# Patient Record
Sex: Female | Born: 1965
Health system: Southern US, Community
[De-identification: ages and names within clinical notes are randomized; demographics above are authoritative.]

## PROBLEM LIST (undated history)

## (undated) DIAGNOSIS — K219 Gastro-esophageal reflux disease without esophagitis: Secondary | ICD-10-CM

## (undated) DIAGNOSIS — R079 Chest pain, unspecified: Secondary | ICD-10-CM

## (undated) DIAGNOSIS — E0590 Thyrotoxicosis, unspecified without thyrotoxic crisis or storm: Secondary | ICD-10-CM

## (undated) DIAGNOSIS — A048 Other specified bacterial intestinal infections: Secondary | ICD-10-CM

## (undated) DIAGNOSIS — A0472 Enterocolitis due to Clostridium difficile, not specified as recurrent: Secondary | ICD-10-CM

## (undated) DIAGNOSIS — R002 Palpitations: Secondary | ICD-10-CM

## (undated) DIAGNOSIS — Z86010 Personal history of colonic polyps: Secondary | ICD-10-CM

## (undated) DIAGNOSIS — H9319 Tinnitus, unspecified ear: Secondary | ICD-10-CM

## (undated) DIAGNOSIS — K5909 Other constipation: Secondary | ICD-10-CM

## (undated) HISTORY — DX: Thyrotoxicosis, unspecified without thyrotoxic crisis or storm: E05.90

## (undated) HISTORY — DX: Other specified bacterial intestinal infections: A04.8

## (undated) HISTORY — DX: Enterocolitis due to Clostridium difficile, not specified as recurrent: A04.72

## (undated) HISTORY — DX: Chest pain, unspecified: R07.9

## (undated) HISTORY — DX: Gastro-esophageal reflux disease without esophagitis: K21.9

## (undated) HISTORY — DX: Personal history of colonic polyps: Z86.010

## (undated) HISTORY — DX: Other constipation: K59.09

## (undated) HISTORY — DX: Palpitations: R00.2

---

## 1988-07-01 HISTORY — PX: HEMORRHOID SURGERY: SHX153

## 2006-09-11 ENCOUNTER — Emergency Department (HOSPITAL_COMMUNITY): Admission: EM | Admit: 2006-09-11 | Discharge: 2006-09-11 | Payer: Self-pay | Admitting: Emergency Medicine

## 2006-09-19 ENCOUNTER — Emergency Department (HOSPITAL_COMMUNITY): Admission: EM | Admit: 2006-09-19 | Discharge: 2006-09-19 | Payer: Self-pay | Admitting: Emergency Medicine

## 2006-12-04 ENCOUNTER — Ambulatory Visit: Payer: Self-pay | Admitting: Internal Medicine

## 2006-12-18 ENCOUNTER — Encounter (INDEPENDENT_AMBULATORY_CARE_PROVIDER_SITE_OTHER): Payer: Self-pay | Admitting: Family Medicine

## 2006-12-18 ENCOUNTER — Ambulatory Visit: Payer: Self-pay | Admitting: *Deleted

## 2006-12-18 ENCOUNTER — Ambulatory Visit: Payer: Self-pay | Admitting: Internal Medicine

## 2007-01-13 ENCOUNTER — Ambulatory Visit: Payer: Self-pay | Admitting: Internal Medicine

## 2007-04-17 ENCOUNTER — Ambulatory Visit (HOSPITAL_COMMUNITY): Admission: RE | Admit: 2007-04-17 | Discharge: 2007-04-17 | Payer: Self-pay | Admitting: Family Medicine

## 2007-04-17 ENCOUNTER — Ambulatory Visit: Payer: Self-pay | Admitting: Family Medicine

## 2007-04-17 ENCOUNTER — Encounter (INDEPENDENT_AMBULATORY_CARE_PROVIDER_SITE_OTHER): Payer: Self-pay | Admitting: Nurse Practitioner

## 2007-04-17 LAB — CONVERTED CEMR LAB
AST: 17 units/L (ref 0–37)
Albumin: 3.9 g/dL (ref 3.5–5.2)
Alkaline Phosphatase: 100 units/L (ref 39–117)
BUN: 9 mg/dL (ref 6–23)
Basophils Relative: 0 % (ref 0–1)
Calcium: 8.7 mg/dL (ref 8.4–10.5)
Eosinophils Relative: 1 % (ref 0–5)
HCT: 35.8 % — ABNORMAL LOW (ref 36.0–46.0)
Lymphocytes Relative: 41 % (ref 12–46)
Lymphs Abs: 2.2 10*3/uL (ref 0.7–3.3)
Monocytes Absolute: 0.6 10*3/uL (ref 0.2–0.7)
Monocytes Relative: 11 % (ref 3–11)
Neutro Abs: 2.5 10*3/uL (ref 1.7–7.7)
Platelets: 272 10*3/uL (ref 150–400)
Potassium: 3.8 meq/L (ref 3.5–5.3)
Sodium: 138 meq/L (ref 135–145)
TSH: 0.342 microintl units/mL — ABNORMAL LOW (ref 0.350–5.50)
Total CK: 59 units/L (ref 7–177)
Troponin I: 0.01 ng/mL (ref <0.06)
aPTT: 37 s (ref 24–37)

## 2007-04-28 ENCOUNTER — Ambulatory Visit (HOSPITAL_COMMUNITY): Admission: RE | Admit: 2007-04-28 | Discharge: 2007-04-28 | Payer: Self-pay | Admitting: Family Medicine

## 2007-04-28 ENCOUNTER — Encounter (INDEPENDENT_AMBULATORY_CARE_PROVIDER_SITE_OTHER): Payer: Self-pay | Admitting: Family Medicine

## 2007-06-10 ENCOUNTER — Emergency Department (HOSPITAL_COMMUNITY): Admission: EM | Admit: 2007-06-10 | Discharge: 2007-06-10 | Payer: Self-pay | Admitting: Emergency Medicine

## 2007-06-17 ENCOUNTER — Emergency Department (HOSPITAL_COMMUNITY): Admission: EM | Admit: 2007-06-17 | Discharge: 2007-06-17 | Payer: Self-pay | Admitting: Family Medicine

## 2007-09-28 ENCOUNTER — Ambulatory Visit: Payer: Self-pay | Admitting: Family Medicine

## 2008-03-17 ENCOUNTER — Encounter (INDEPENDENT_AMBULATORY_CARE_PROVIDER_SITE_OTHER): Payer: Self-pay | Admitting: Internal Medicine

## 2008-03-17 ENCOUNTER — Ambulatory Visit: Payer: Self-pay | Admitting: Adult Health

## 2008-03-17 LAB — CONVERTED CEMR LAB
Eosinophils Absolute: 0 10*3/uL (ref 0.0–0.7)
Hemoglobin: 10.8 g/dL — ABNORMAL LOW (ref 12.0–15.0)
Lymphocytes Relative: 31 % (ref 12–46)
Lymphs Abs: 1.4 10*3/uL (ref 0.7–4.0)
MCV: 78.9 fL (ref 78.0–100.0)
Monocytes Absolute: 0.6 10*3/uL (ref 0.1–1.0)
Monocytes Relative: 12 % (ref 3–12)
Platelets: 251 10*3/uL (ref 150–400)
Potassium: 3.8 meq/L (ref 3.5–5.3)
RDW: 14.3 % (ref 11.5–15.5)
Total Bilirubin: 0.5 mg/dL (ref 0.3–1.2)
Total Protein: 7.8 g/dL (ref 6.0–8.3)
WBC: 4.4 10*3/uL (ref 4.0–10.5)

## 2008-03-18 ENCOUNTER — Encounter (INDEPENDENT_AMBULATORY_CARE_PROVIDER_SITE_OTHER): Payer: Self-pay | Admitting: Internal Medicine

## 2008-05-19 ENCOUNTER — Ambulatory Visit (HOSPITAL_COMMUNITY): Admission: RE | Admit: 2008-05-19 | Discharge: 2008-05-19 | Payer: Self-pay | Admitting: Internal Medicine

## 2008-06-07 ENCOUNTER — Ambulatory Visit: Payer: Self-pay | Admitting: Internal Medicine

## 2008-06-07 ENCOUNTER — Encounter (INDEPENDENT_AMBULATORY_CARE_PROVIDER_SITE_OTHER): Payer: Self-pay | Admitting: Adult Health

## 2008-06-07 LAB — CONVERTED CEMR LAB
BUN: 10 mg/dL (ref 6–23)
Basophils Absolute: 0 10*3/uL (ref 0.0–0.1)
Basophils Relative: 0 % (ref 0–1)
Calcium: 8.5 mg/dL (ref 8.4–10.5)
Chloride: 105 meq/L (ref 96–112)
Creatinine, Ser: 0.61 mg/dL (ref 0.40–1.20)
Glucose, Bld: 78 mg/dL (ref 70–99)
HCT: 34.4 % — ABNORMAL LOW (ref 36.0–46.0)
Helicobacter Pylori Antibody-IgG: 2.3 — ABNORMAL HIGH
Lymphs Abs: 2.5 10*3/uL (ref 0.7–4.0)
MCHC: 29.9 g/dL — ABNORMAL LOW (ref 30.0–36.0)
MCV: 78.7 fL (ref 78.0–100.0)
Monocytes Relative: 10 % (ref 3–12)
Neutro Abs: 2.7 10*3/uL (ref 1.7–7.7)
Platelets: 352 10*3/uL (ref 150–400)
Potassium: 4.6 meq/L (ref 3.5–5.3)
RBC: 4.37 M/uL (ref 3.87–5.11)
RDW: 15 % (ref 11.5–15.5)
Sodium: 137 meq/L (ref 135–145)
WBC: 5.9 10*3/uL (ref 4.0–10.5)

## 2008-06-10 ENCOUNTER — Ambulatory Visit (HOSPITAL_COMMUNITY): Admission: RE | Admit: 2008-06-10 | Discharge: 2008-06-10 | Payer: Self-pay | Admitting: Internal Medicine

## 2008-07-20 ENCOUNTER — Encounter (INDEPENDENT_AMBULATORY_CARE_PROVIDER_SITE_OTHER): Payer: Self-pay | Admitting: Adult Health

## 2008-07-20 ENCOUNTER — Ambulatory Visit: Payer: Self-pay | Admitting: Internal Medicine

## 2008-07-20 LAB — CONVERTED CEMR LAB
Basophils Absolute: 0 10*3/uL (ref 0.0–0.1)
Eosinophils Relative: 0 % (ref 0–5)
GC Probe Amp, Genital: NEGATIVE
Helicobacter Pylori Antibody-IgG: 2.5 — ABNORMAL HIGH
Hemoglobin: 10.9 g/dL — ABNORMAL LOW (ref 12.0–15.0)
Platelets: 287 10*3/uL (ref 150–400)
T4, Total: 10.1 ug/dL (ref 5.0–12.5)
WBC: 5 10*3/uL (ref 4.0–10.5)

## 2008-10-18 ENCOUNTER — Ambulatory Visit: Payer: Self-pay | Admitting: Internal Medicine

## 2008-11-02 ENCOUNTER — Ambulatory Visit (HOSPITAL_COMMUNITY): Admission: RE | Admit: 2008-11-02 | Discharge: 2008-11-02 | Payer: Self-pay | Admitting: Internal Medicine

## 2008-11-29 ENCOUNTER — Ambulatory Visit: Payer: Self-pay | Admitting: Family Medicine

## 2008-12-05 ENCOUNTER — Ambulatory Visit (HOSPITAL_COMMUNITY): Admission: RE | Admit: 2008-12-05 | Discharge: 2008-12-05 | Payer: Self-pay | Admitting: Internal Medicine

## 2010-12-18 ENCOUNTER — Inpatient Hospital Stay (INDEPENDENT_AMBULATORY_CARE_PROVIDER_SITE_OTHER)
Admission: RE | Admit: 2010-12-18 | Discharge: 2010-12-18 | Disposition: A | Discharge status: Home / Self Care | Payer: Self-pay | Source: Ambulatory Visit | Attending: Emergency Medicine | Admitting: Emergency Medicine

## 2010-12-18 DIAGNOSIS — J069 Acute upper respiratory infection, unspecified: Secondary | ICD-10-CM

## 2010-12-18 DIAGNOSIS — J019 Acute sinusitis, unspecified: Secondary | ICD-10-CM

## 2010-12-30 ENCOUNTER — Emergency Department (HOSPITAL_COMMUNITY): Payer: Self-pay

## 2010-12-30 ENCOUNTER — Emergency Department (HOSPITAL_COMMUNITY)
Admission: EM | Admit: 2010-12-30 | Discharge: 2010-12-30 | Disposition: A | Discharge status: Home / Self Care | Payer: Self-pay | Attending: Emergency Medicine | Admitting: Emergency Medicine

## 2010-12-30 DIAGNOSIS — R197 Diarrhea, unspecified: Secondary | ICD-10-CM | POA: Insufficient documentation

## 2010-12-30 DIAGNOSIS — R110 Nausea: Secondary | ICD-10-CM | POA: Insufficient documentation

## 2010-12-30 DIAGNOSIS — R109 Unspecified abdominal pain: Secondary | ICD-10-CM | POA: Insufficient documentation

## 2010-12-30 LAB — DIFFERENTIAL
Basophils Absolute: 0 10*3/uL (ref 0.0–0.1)
Basophils Relative: 0 % (ref 0–1)
Eosinophils Absolute: 0 10*3/uL (ref 0.0–0.7)
Lymphocytes Relative: 18 % (ref 12–46)
Lymphs Abs: 1.5 10*3/uL (ref 0.7–4.0)
Monocytes Absolute: 0.4 10*3/uL (ref 0.1–1.0)
Monocytes Relative: 5 % (ref 3–12)
Neutro Abs: 6.4 10*3/uL (ref 1.7–7.7)
Neutrophils Relative %: 77 % (ref 43–77)

## 2010-12-30 LAB — COMPREHENSIVE METABOLIC PANEL
ALT: 9 U/L (ref 0–35)
AST: 16 U/L (ref 0–37)
Albumin: 3.1 g/dL — ABNORMAL LOW (ref 3.5–5.2)
Alkaline Phosphatase: 88 U/L (ref 39–117)
CO2: 26 mEq/L (ref 19–32)
Creatinine, Ser: 0.58 mg/dL (ref 0.50–1.10)
GFR calc non Af Amer: >60 mL/min (ref >60)
Total Bilirubin: 0.2 mg/dL — ABNORMAL LOW (ref 0.3–1.2)

## 2010-12-30 LAB — URINALYSIS, ROUTINE W REFLEX MICROSCOPIC
Bilirubin Urine: NEGATIVE
Glucose, UA: NEGATIVE mg/dL
Hgb urine dipstick: NEGATIVE
Leukocytes, UA: NEGATIVE
Urobilinogen, UA: 0.2 mg/dL (ref 0.0–1.0)

## 2010-12-30 LAB — LIPASE, BLOOD: Lipase: 22 U/L (ref 11–59)

## 2010-12-30 LAB — CBC
MCHC: 31.9 g/dL (ref 30.0–36.0)
WBC: 8.3 10*3/uL (ref 4.0–10.5)

## 2010-12-30 MED ORDER — IOHEXOL 300 MG/ML  SOLN
100.0000 mL | Freq: Once | INTRAMUSCULAR | Status: AC | PRN
  Administered 2010-12-30: 100 mL via INTRAVENOUS

## 2011-01-01 ENCOUNTER — Inpatient Hospital Stay (INDEPENDENT_AMBULATORY_CARE_PROVIDER_SITE_OTHER)
Admission: RE | Admit: 2011-01-01 | Discharge: 2011-01-01 | Disposition: A | Discharge status: Home / Self Care | Payer: Self-pay | Source: Ambulatory Visit | Attending: Family Medicine | Admitting: Family Medicine

## 2011-01-01 DIAGNOSIS — R109 Unspecified abdominal pain: Secondary | ICD-10-CM

## 2011-01-01 DIAGNOSIS — R197 Diarrhea, unspecified: Secondary | ICD-10-CM

## 2011-01-01 LAB — POCT URINALYSIS DIP (DEVICE)
Bilirubin Urine: NEGATIVE
Bilirubin Urine: NEGATIVE
Ketones, ur: NEGATIVE mg/dL
Ketones, ur: NEGATIVE mg/dL
Protein, ur: NEGATIVE mg/dL
Specific Gravity, Urine: 1.01 (ref 1.005–1.030)
Specific Gravity, Urine: <=1.005 (ref 1.005–1.030)
pH: 5.5 (ref 5.0–8.0)
pH: 6 (ref 5.0–8.0)

## 2011-01-01 LAB — URINE CULTURE

## 2011-01-03 ENCOUNTER — Inpatient Hospital Stay (INDEPENDENT_AMBULATORY_CARE_PROVIDER_SITE_OTHER)
Admission: RE | Admit: 2011-01-03 | Discharge: 2011-01-03 | Disposition: A | Discharge status: Home / Self Care | Payer: Self-pay | Source: Ambulatory Visit | Attending: Emergency Medicine | Admitting: Emergency Medicine

## 2011-01-03 DIAGNOSIS — R109 Unspecified abdominal pain: Secondary | ICD-10-CM

## 2011-01-03 LAB — GIARDIA/CRYPTOSPORIDIUM SCREEN(EIA)
Cryptosporidium Screen (EIA): NEGATIVE
Giardia Screen - EIA: NEGATIVE

## 2011-01-05 LAB — STOOL CULTURE

## 2011-02-18 ENCOUNTER — Other Ambulatory Visit (HOSPITAL_COMMUNITY): Payer: Self-pay | Admitting: Gastroenterology

## 2011-02-18 DIAGNOSIS — R112 Nausea with vomiting, unspecified: Secondary | ICD-10-CM

## 2011-03-05 ENCOUNTER — Encounter (HOSPITAL_COMMUNITY)
Admission: RE | Admit: 2011-03-05 | Discharge: 2011-03-05 | Disposition: A | Discharge status: Home / Self Care | Payer: Self-pay | Source: Ambulatory Visit | Attending: Gastroenterology | Admitting: Gastroenterology

## 2011-03-05 DIAGNOSIS — R112 Nausea with vomiting, unspecified: Secondary | ICD-10-CM | POA: Insufficient documentation

## 2011-03-05 DIAGNOSIS — Z79899 Other long term (current) drug therapy: Secondary | ICD-10-CM | POA: Insufficient documentation

## 2011-03-05 MED ORDER — TECHNETIUM TC 99M SULFUR COLLOID
2.0000 | Freq: Once | INTRAVENOUS | Status: AC | PRN
  Administered 2011-03-05: 2 via INTRAVENOUS

## 2011-03-06 ENCOUNTER — Encounter (HOSPITAL_COMMUNITY)
Admission: RE | Admit: 2011-03-06 | Discharge: 2011-03-06 | Disposition: A | Discharge status: Home / Self Care | Payer: Self-pay | Source: Ambulatory Visit | Attending: Surgery | Admitting: Surgery

## 2011-03-06 ENCOUNTER — Ambulatory Visit (INDEPENDENT_AMBULATORY_CARE_PROVIDER_SITE_OTHER): Payer: PRIVATE HEALTH INSURANCE | Admitting: Surgery

## 2011-03-06 ENCOUNTER — Encounter (INDEPENDENT_AMBULATORY_CARE_PROVIDER_SITE_OTHER): Payer: Self-pay | Admitting: General Surgery

## 2011-03-06 ENCOUNTER — Encounter (INDEPENDENT_AMBULATORY_CARE_PROVIDER_SITE_OTHER): Payer: Self-pay | Admitting: Surgery

## 2011-03-06 VITALS — BP 114/70 | HR 64 | Temp 97.9°F | Ht 62.0 in | Wt 163.4 lb

## 2011-03-06 DIAGNOSIS — K5900 Constipation, unspecified: Secondary | ICD-10-CM

## 2011-03-06 DIAGNOSIS — K811 Chronic cholecystitis: Secondary | ICD-10-CM | POA: Insufficient documentation

## 2011-03-06 DIAGNOSIS — K8020 Calculus of gallbladder without cholecystitis without obstruction: Secondary | ICD-10-CM

## 2011-03-06 DIAGNOSIS — K828 Other specified diseases of gallbladder: Secondary | ICD-10-CM

## 2011-03-06 DIAGNOSIS — K5909 Other constipation: Secondary | ICD-10-CM

## 2011-03-06 DIAGNOSIS — K219 Gastro-esophageal reflux disease without esophagitis: Secondary | ICD-10-CM | POA: Insufficient documentation

## 2011-03-06 LAB — CBC
Hemoglobin: 10.7 g/dL — ABNORMAL LOW (ref 12.0–15.0)
MCH: 25.1 pg — ABNORMAL LOW (ref 26.0–34.0)
MCHC: 32.1 g/dL (ref 30.0–36.0)
MCV: 78 fL (ref 78.0–100.0)

## 2011-03-06 LAB — HCG, SERUM, QUALITATIVE: Preg, Serum: NEGATIVE

## 2011-03-06 NOTE — Patient Instructions (Signed)

## 2011-03-06 NOTE — Progress Notes (Signed)
Subjective:     Patient ID: Angel Bennett, female   DOB: 08/17/1965, 45 y.o.   MRN: [id number]  PCP: Dala Dock  Consulting MD: Dr. Odelia Gage  HPI  Reason for visit: Abdominal pain. Gallbladder sludge. Question biliary etiology.  Patient is a pleasant female originally from [place] (in [place] greater than 20 years). She has had episodes of intermittent abdominal pain. She tends to have diffuse soreness. She struggles with chronic constipation. MiraLax helps. She did have an episode of diarrhea earlier this summer and was found to be C. differential positive. Treated with oral Flagyl last month. No problems since.  Her main issue recently has been that she gets abdominal pain after eating. Especially oily and greasy foods. Tends to be worse than the right upper quadrant but he can be all over. She will get nauseated. She will get bloated. She's forced herself to vomit a few times to see if that would help her feel better. No sick contacts or travel history since then. There's some question history of ulcer disease in prior records but she denies this to me. No one else has been sick around her. She does not drink alcohol. No history of hepatitis or pancreatitis.  She's been emergency room and seen her gastroenterologist as well. CT scan was negative. Ultrasound showed gallbladder sludge otherwise negative. Gastric emptying study is normal. Her gastroenterologist started her on a PPI (Nexium).  She notes it's helped her some mild reflux but is not changed her pain. She still has attacks with this. Based the concerns of probable biliary colic with known sludge and workup otherwise negative, Dr. Dulce Sellar sent the patient to [place] for evaluation.  Past Medical History  Diagnosis Date  . Abdominal pain   . C. difficile colitis [id number]    Treated w PO Metronidazole   Past Surgical History  Procedure Date  . Hemorrhoid surgery 1990  . Cesarean section 1990   No current outpatient prescriptions on file  prior to visit.   No Known Allergies   Review of Systems  Constitutional: Negative for fever, chills, diaphoresis, appetite change and fatigue.  HENT: Negative for ear pain, sore throat, trouble swallowing, neck pain and ear discharge.   Eyes: Negative for photophobia, discharge and visual disturbance.  Respiratory: Negative for cough, choking, chest tightness and shortness of breath.   Cardiovascular: Negative for chest pain and palpitations.  Gastrointestinal: Positive for nausea and constipation. Negative for vomiting, diarrhea, blood in stool, abdominal distention, anal bleeding and rectal pain.  Genitourinary: Negative for dysuria, frequency and difficulty urinating.  Musculoskeletal: Negative for myalgias and gait problem.  Skin: Negative for color change, pallor and rash.  Neurological: Negative for dizziness, speech difficulty, weakness and numbness.  Hematological: Negative for adenopathy.  Psychiatric/Behavioral: Negative for confusion and agitation. The patient is not nervous/anxious.        Objective:   Physical Exam  Constitutional: She is oriented to person, place, and time. She appears well-developed and well-nourished. No distress.  HENT:  Head: Normocephalic.  Mouth/Throat: Oropharynx is clear and moist. No oropharyngeal exudate.  Eyes: Conjunctivae and EOM are normal. Pupils are equal, round, and reactive to light. No scleral icterus.  Neck: Normal range of motion. Neck supple. No tracheal deviation present.  Cardiovascular: Normal rate, regular rhythm and intact distal pulses.   Pulmonary/Chest: Effort normal and breath sounds normal. No respiratory distress. She exhibits no tenderness.  Abdominal: Soft. She exhibits no distension and no mass. Hernia confirmed negative in the right inguinal area  and confirmed negative in the left inguinal area.       Very mild diffuse soreness.  Mild TTP RUQ  Genitourinary: No vaginal discharge found.  Musculoskeletal: Normal  range of motion. She exhibits no tenderness.  Lymphadenopathy:    She has no cervical adenopathy.       Right: No inguinal adenopathy present.       Left: No inguinal adenopathy present.  Neurological: She is alert and oriented to person, place, and time. No cranial nerve deficit. She exhibits normal muscle tone. Coordination normal.  Skin: Skin is warm and dry. No rash noted. She is not diaphoretic. No erythema.  Psychiatric: She has a normal mood and affect. Her behavior is normal. Judgment and thought content normal.   Studies: As per HPI    Assessment:     Intermittent postprandial abdominal pain, most likely biliary. Probable chronic cholecystitis in the setting of sludge.  Gastroesophageal reflux disease. Mild and controlled with ppi  Chronic constipation. Improved with laxative MiraLax    Plan:     Given the fact that the differential diagnosis does not seem to suggest at this point, I think she would benefit from surgery to remove her gallbladder. I did caution her that given her other abdominal issues this may not solve all her problems.  I strongly recommend she get on a bowel regimen such as MiraLax every day to have bowel movements every day. I would hope that would help with some of her abdominal discomfort and fullness. I will defer to need to continue the Nexium to her gastroenterologist.  I think she would be reasonable candidate for single-site approach.  The anatomy & physiology of hepatobiliary & pancreatic function was discussed.  The pathophysiology of gallbladder dysfunction was discussed.  Natural history risks without surgery was discussed.   I feel the risks of no intervention will lead to serious problems that outweigh the operative risks; therefore, I recommended cholecystectomy to remove the pathology.  I explained laparoscopic techniques with possible need for an open approach.  Probable cholangiogram to evaluate the bilary tract was explained as well.     Risks such as bleeding, infection, abscess, leak, injury to other organs, need for further treatment, heart attack, death, and other risks were discussed.  Possibility that this will not correct all abdominal symptoms was explained.  Goals of post-operative recovery were discussed as well.  We will work to minimize complications.  An educational handout further explaining the pathology and treatment options was given as well.  Questions were answered.  The patient expresses understanding & wishes to proceed with surgery.

## 2011-03-08 ENCOUNTER — Other Ambulatory Visit (INDEPENDENT_AMBULATORY_CARE_PROVIDER_SITE_OTHER): Payer: Self-pay | Admitting: Surgery

## 2011-03-08 ENCOUNTER — Ambulatory Visit (HOSPITAL_COMMUNITY)
Admission: RE | Admit: 2011-03-08 | Discharge: 2011-03-09 | Disposition: A | Discharge status: Home / Self Care | Payer: Self-pay | Source: Ambulatory Visit | Attending: Surgery | Admitting: Surgery

## 2011-03-08 ENCOUNTER — Ambulatory Visit (HOSPITAL_COMMUNITY): Payer: Self-pay

## 2011-03-08 DIAGNOSIS — K811 Chronic cholecystitis: Secondary | ICD-10-CM

## 2011-03-08 DIAGNOSIS — Z01812 Encounter for preprocedural laboratory examination: Secondary | ICD-10-CM | POA: Insufficient documentation

## 2011-03-10 ENCOUNTER — Emergency Department (HOSPITAL_COMMUNITY)
Admission: EM | Admit: 2011-03-10 | Discharge: 2011-03-11 | Disposition: A | Discharge status: Home / Self Care | Payer: Self-pay | Attending: Emergency Medicine | Admitting: Emergency Medicine

## 2011-03-10 DIAGNOSIS — R55 Syncope and collapse: Secondary | ICD-10-CM | POA: Insufficient documentation

## 2011-03-10 DIAGNOSIS — R42 Dizziness and giddiness: Secondary | ICD-10-CM | POA: Insufficient documentation

## 2011-03-10 DIAGNOSIS — Z8711 Personal history of peptic ulcer disease: Secondary | ICD-10-CM | POA: Insufficient documentation

## 2011-03-11 LAB — COMPREHENSIVE METABOLIC PANEL
BUN: 6 mg/dL (ref 6–23)
CO2: 24 mEq/L (ref 19–32)
Calcium: 8.5 mg/dL (ref 8.4–10.5)
Creatinine, Ser: 0.57 mg/dL (ref 0.50–1.10)
GFR calc Af Amer: >60 mL/min (ref >60)
GFR calc non Af Amer: >60 mL/min (ref >60)
Glucose, Bld: 129 mg/dL — ABNORMAL HIGH (ref 70–99)
Sodium: 135 mEq/L (ref 135–145)
Total Protein: 7 g/dL (ref 6.0–8.3)

## 2011-03-11 LAB — DIFFERENTIAL
Basophils Relative: 0 % (ref 0–1)
Lymphs Abs: 1.1 10*3/uL (ref 0.7–4.0)
Monocytes Absolute: 0.6 10*3/uL (ref 0.1–1.0)
Monocytes Relative: 12 % (ref 3–12)
Neutro Abs: 3.1 10*3/uL (ref 1.7–7.7)

## 2011-03-11 LAB — CBC
HCT: 29.8 % — ABNORMAL LOW (ref 36.0–46.0)
Hemoglobin: 9.6 g/dL — ABNORMAL LOW (ref 12.0–15.0)
MCH: 25.2 pg — ABNORMAL LOW (ref 26.0–34.0)
MCHC: 32.2 g/dL (ref 30.0–36.0)
MCV: 78.2 fL (ref 78.0–100.0)
RBC: 3.81 MIL/uL — ABNORMAL LOW (ref 3.87–5.11)

## 2011-03-11 LAB — LIPASE, BLOOD: Lipase: 21 U/L (ref 11–59)

## 2011-03-12 NOTE — Op Note (Signed)
NAMERHONDALYN, CLINGAN                  ACCOUNT NO.:  [id number]  MEDICAL RECORD NO.:  [id number]  LOCATION:  5114                         FACILITY:  [facility]  PHYSICIAN:  Ardeth Sportsman, MD     DATE OF BIRTH:  January 21, 1966  DATE OF PROCEDURE:  03/08/2011 DATE OF DISCHARGE:                              OPERATIVE REPORT   PRIMARY CARE PHYSICIAN:  [facility].  REQUESTING PHYSICIAN:  Willis Modena, MD, with [facility].  OPERATING SURGEON:  Ardeth Sportsman, MD  ASSISTANT:  RN.  PREOPERATIVE DIAGNOSIS:  Biliary colic by probable chronic cholecystitis in the setting of gallbladder sludge.  POSTOPERATIVE DIAGNOSIS:  Biliary colic by probable chronic cholecystitis in the setting of gallbladder sludge.  PROCEDURE PERFORMED:  Laparoscopic cholecystectomy with intraoperative cholangiogram (single-side technique).  SPECIMEN:  Gallbladder.  DRAINS:  None.  ESTIMATED BLOOD LOSS:  Minimal.  COMPLICATIONS:  None apparent.  INDICATIONS:  Ms. Llewellyn is a pleasant woman originally from [place], who is living here for several decades, who had an episode of postprandial abdominal pain most consistent with biliary colic.  She had an ultrasound that showed obvious sludge.  She saw Gastroenterology and workup was unconvincing for nonbiliary etiology.  She was sent to me for a surgical evaluation.  The anatomy and physiology of hepatobiliary and pancreatic function was discussed.  Pathophysiology of chronic cholecystitis was discussed with its natural history risks.  Options were discussed and recommendations were made for cholecystectomy, open single-side and laparoscopic techniques were discussed.  Risks, benefits, and alternatives were discussed.  Questions answered and she agreed to proceed.  OPERATIVE FINDINGS:  She had a kind of dilated boggy gallbladder with a little mild thickening consistent with chronic cholecystitis.  A few adhesions from mesocolon and omentum as well.  Her cystic duct  was rather narrow but a normal biliary system with classic anatomy.  DESCRIPTION OF PROCEDURE:  Informed consent was confirmed.  The patient underwent general anesthesia without any difficulty.  She was positioned supine with arms tucked.  She had sequential compression devices active during the entire case.  Her abdomen was prepped and draped in sterile fashion.  Surgical time out confirmed our plan.  I made an incision to the superior part of the umbilicus.  I did cut down to place a 5-mm port in the supraumbilical fascia using a Hasson cut down technique.  Entry was clean.  I induced carbon dioxide insufflation.  Camera inspection revealed no injury.  She had minimal omental adhesions in the suprapubic region, but all this was well in for umbilical.  I went ahead and placed a 5-mm port in the left upper aspect of the wound and 5-mm grasper in the right inferior umbilical aspect of the wound.  I turned attention towards the right upper quadrant.  I grasped the fundus of the gallbladder and elevated cephalad.  I freed omental attachments to the gallbladder as well as the duodenal bulb which was swept off as well.  I did alternate between [place] blunt dissection and focused harmonic ultrasonic dissection to free the peritoneal coverings between the gallbladder and the liver on the anteromedial and posterolateral walls.  I found isolated lymph node of  Calot and ligated the lymphatics to keep the lymph node of Calot with the specimen.  I located an isolated dominant anterior cystic artery branch and a smaller posterior cystic artery branch.  After getting a critical view by fraying the proximal third of the gallbladder off the liver bed, I went ahead and ligated these structures.  I mobilized the gallbladder a little bit more to get a better view of the infundibulum and the cystic duct.  I skeletonized the cystic duct for about 3 cm.  I placed a clip on the infundibulum and did a  partial cystic ductotomy. I milked back a clear bile.  There were no stones.  I placed a #5 [place] cholangiocatheter through puncture site in the right subcostal region into the cystic duct.  I ran the cholangiogram using dilute radiopaque contrast and continuous fluoroscopy.  Contrast flowed from a side branch mildly helical, consistent with cystic duct cannulization.  Contrast easily refluxed up to the common hepatic duct into the right and left intrahepatic chains flow down the common bile duct across the normal ampulla into the duodenum.  This is the normal cholangiogram.  I went ahead and removed the cholangiocatheter.  I placed 4 clips on the cystic duct just proximal to the cystic ductotomy and completed the cystic duct transection.  I freed the gallbladder from its main attachments on the liver bed.  I did some irrigation and inspected. Hemostasis was excellent in the liver bed and clips were intact in the cystic duct.  There was no leak of bile or blood.  I am going to have remove the gallbladder out the supraumbilical fascia defect after some general dilation about 8 mm to get out.  I evacuated  carbon dioxide.  I closed the fascia using 0 Vicryl interrupted stitches transversely and closed the skin using 4-0 Monocryl stitch.  Sterile dressing was applied.  The patient was extubated.  The PACU was on hold but is being monitored.  I discussed the postoperative care in detail with the patient in the office.  I discussed with her in the holding area.  I discussed with her husband and family as well.     Ardeth Sportsman, MD     SCG/MEDQ  D:  03/08/2011  T:  03/08/2011  Job:  [id number]  cc:   Willis Modena, MD [facility]  Electronically Signed by Karie Soda MD on 03/12/2011 02:07:42 PM

## 2011-03-13 HISTORY — PX: CHOLECYSTECTOMY: SHX55

## 2011-03-20 ENCOUNTER — Encounter (INDEPENDENT_AMBULATORY_CARE_PROVIDER_SITE_OTHER): Payer: Self-pay | Admitting: Surgery

## 2011-03-20 ENCOUNTER — Ambulatory Visit (INDEPENDENT_AMBULATORY_CARE_PROVIDER_SITE_OTHER): Payer: Self-pay | Admitting: Surgery

## 2011-03-20 VITALS — BP 102/70 | HR 72 | Temp 97.2°F | Resp 16 | Ht 62.0 in | Wt 160.2 lb

## 2011-03-20 DIAGNOSIS — K219 Gastro-esophageal reflux disease without esophagitis: Secondary | ICD-10-CM

## 2011-03-20 DIAGNOSIS — K5900 Constipation, unspecified: Secondary | ICD-10-CM

## 2011-03-20 DIAGNOSIS — K811 Chronic cholecystitis: Secondary | ICD-10-CM

## 2011-03-20 DIAGNOSIS — K5909 Other constipation: Secondary | ICD-10-CM

## 2011-03-20 NOTE — Progress Notes (Signed)
Subjective:     Patient ID: Angel Bennett, female   DOB: 13-Feb-1966, 45 y.o.   MRN: [id number]  HPI  Patient Care Team: Provider Not In System as PCP - General Dionne Ano Outlaw as Consulting Physician (Gastroenterology)  Reason for visit: Followup after her laparoscopic cholecystectomy.  Patient comes in today now postoperative day #7. She has some soreness in her upper abdomen but that is decreasing. Appetite is improving. She is still constipated. She's been using MiraLax once a day. She does not think that's working. She has some reflux and she's tried to avoid spicy foods. She denies any fevers or chills nor sweats. Energy levels returning. She is a little concerned about return to work through soon as she still having some soreness and has to moderate amount of activity in.  Past Medical History  Diagnosis Date  . Abdominal pain     Diffuse mild  . C. difficile colitis [id number]    Treated w PO Metronidazole  . Constipation, chronic   . GERD (gastroesophageal reflux disease)     Past Surgical History  Procedure Date  . Hemorrhoid surgery 1990  . Cesarean section 1990  . Cholecystectomy 03/13/2011    lap single site    History   Social History  . Marital Status: Married    Spouse Name: N/A    Number of Children: N/A  . Years of Education: N/A   Occupational History  . Not on file.   Social History Main Topics  . Smoking status: Never Smoker   . Smokeless tobacco: Not on file  . Alcohol Use: No  . Drug Use: No  . Sexually Active: Not on file   Other Topics Concern  . Not on file   Social History Narrative  . No narrative on file    No family history on file.  Current outpatient prescriptions:esomeprazole (NEXIUM) 40 MG capsule, Take 40 mg by mouth 2 (two) times daily. Increase to 2x/day for two weeks, then go back to daily, Disp: , Rfl:   No Known Allergies     Review of Systems  Constitutional: Negative for fever, chills and diaphoresis.  HENT:  Negative for ear pain, sore throat and trouble swallowing.   Eyes: Negative for photophobia and visual disturbance.  Respiratory: Negative for cough and choking.   Cardiovascular: Negative for chest pain and palpitations.  Gastrointestinal: Negative for nausea, vomiting, diarrhea, constipation, abdominal distention, anal bleeding and rectal pain.  Genitourinary: Negative for dysuria, frequency and difficulty urinating.  Musculoskeletal: Negative for myalgias and gait problem.  Skin: Negative for color change, pallor and rash.  Neurological: Negative for dizziness, speech difficulty, weakness and numbness.  Hematological: Negative for adenopathy.  Psychiatric/Behavioral: Negative for confusion and agitation. The patient is not nervous/anxious.        Objective:   Physical Exam  Constitutional: She is oriented to person, place, and time. She appears well-developed and well-nourished. No distress.  HENT:  Head: Normocephalic.  Mouth/Throat: Oropharynx is clear and moist. No oropharyngeal exudate.  Eyes: Conjunctivae and EOM are normal. Pupils are equal, round, and reactive to light. No scleral icterus.  Neck: Normal range of motion. Neck supple. No tracheal deviation present.  Cardiovascular: Normal rate, regular rhythm and intact distal pulses.   Pulmonary/Chest: Effort normal and breath sounds normal. No respiratory distress. She exhibits no tenderness.  Abdominal: Soft. She exhibits no distension and no mass. There is no rebound and no guarding. Hernia confirmed negative in the right inguinal area and confirmed  negative in the left inguinal area.       Incision clean at bellybutton with normal healing ridge.  No hernias.  Mild RUQ TTP  Genitourinary: No vaginal discharge found.  Musculoskeletal: Normal range of motion. She exhibits no tenderness.  Lymphadenopathy:    She has no cervical adenopathy.       Right: No inguinal adenopathy present.       Left: No inguinal adenopathy present.   Neurological: She is alert and oriented to person, place, and time. No cranial nerve deficit. She exhibits normal muscle tone. Coordination normal.  Skin: Skin is warm and dry. No rash noted. She is not diaphoretic. No erythema.  Psychiatric: She has a normal mood and affect. Her behavior is normal. Judgment and thought content normal.       Assessment:     Chronic cholecystitis and status post cholecystectomy  Chronic constipation, inadequately controlled with daily MiraLax  Reflux disease, inadequately controlled with daily Nexium   Plan:     Switch to smaller meals. Avoid spicy foods. This should improve over the next 2 months.  Consider doubling Nexium for 2 weeks to see if that helps control reflux better.  Increase MiraLax until having a daily bowel movement. I noted there is no roll upper limit to MiraLax. Needs to be slowly adjusted week by week. I think increasing to b.i.d. should be sufficient.  Okay delay return to work until 01Oct2012.  Return to clinic p.r.n. she expressed appreciation

## 2011-03-20 NOTE — Patient Instructions (Signed)

## 2011-04-08 LAB — URINALYSIS, ROUTINE W REFLEX MICROSCOPIC
Glucose, UA: NEGATIVE
Ketones, ur: NEGATIVE
Protein, ur: NEGATIVE
Urobilinogen, UA: 0.2

## 2011-04-08 LAB — PREGNANCY, URINE: Preg Test, Ur: NEGATIVE

## 2011-05-15 ENCOUNTER — Other Ambulatory Visit: Payer: Self-pay | Admitting: Gastroenterology

## 2011-05-15 ENCOUNTER — Encounter (HOSPITAL_COMMUNITY)
Admission: RE | Disposition: A | Discharge status: Home / Self Care | Payer: Self-pay | Source: Ambulatory Visit | Attending: Gastroenterology

## 2011-05-15 ENCOUNTER — Ambulatory Visit (HOSPITAL_COMMUNITY)
Admission: RE | Admit: 2011-05-15 | Discharge: 2011-05-15 | Disposition: A | Discharge status: Home / Self Care | Payer: Self-pay | Source: Ambulatory Visit | Attending: Gastroenterology | Admitting: Gastroenterology

## 2011-05-15 ENCOUNTER — Encounter (HOSPITAL_COMMUNITY): Payer: Self-pay

## 2011-05-15 DIAGNOSIS — Z8711 Personal history of peptic ulcer disease: Secondary | ICD-10-CM | POA: Insufficient documentation

## 2011-05-15 DIAGNOSIS — R109 Unspecified abdominal pain: Secondary | ICD-10-CM | POA: Insufficient documentation

## 2011-05-15 DIAGNOSIS — K2990 Gastroduodenitis, unspecified, without bleeding: Secondary | ICD-10-CM | POA: Insufficient documentation

## 2011-05-15 DIAGNOSIS — K219 Gastro-esophageal reflux disease without esophagitis: Secondary | ICD-10-CM | POA: Insufficient documentation

## 2011-05-15 DIAGNOSIS — K5900 Constipation, unspecified: Secondary | ICD-10-CM | POA: Insufficient documentation

## 2011-05-15 DIAGNOSIS — Z79899 Other long term (current) drug therapy: Secondary | ICD-10-CM | POA: Insufficient documentation

## 2011-05-15 DIAGNOSIS — K648 Other hemorrhoids: Secondary | ICD-10-CM | POA: Insufficient documentation

## 2011-05-15 DIAGNOSIS — K2970 Gastritis, unspecified, without bleeding: Secondary | ICD-10-CM | POA: Insufficient documentation

## 2011-05-15 HISTORY — PX: COLONOSCOPY: SHX5424

## 2011-05-15 HISTORY — PX: ESOPHAGOGASTRODUODENOSCOPY: SHX5428

## 2011-05-15 SURGERY — EGD (ESOPHAGOGASTRODUODENOSCOPY)
Anesthesia: Moderate Sedation

## 2011-05-15 MED ORDER — MIDAZOLAM HCL 10 MG/2ML IJ SOLN
INTRAMUSCULAR | Status: DC | PRN
  Administered 2011-05-15 (×3): 2 mg via INTRAVENOUS

## 2011-05-15 MED ORDER — SODIUM CHLORIDE 0.9 % IV SOLN
INTRAVENOUS | Status: DC
  Administered 2011-05-15: 500 mL via INTRAVENOUS

## 2011-05-15 MED ORDER — BUTAMBEN-TETRACAINE-BENZOCAINE 2-2-14 % EX AERO
INHALATION_SPRAY | CUTANEOUS | Status: DC | PRN
  Administered 2011-05-15: 2 via TOPICAL

## 2011-05-15 MED ORDER — FENTANYL CITRATE 0.05 MG/ML IJ SOLN
INTRAMUSCULAR | Status: AC
  Filled 2011-05-15: qty 4

## 2011-05-15 MED ORDER — MIDAZOLAM HCL 10 MG/2ML IJ SOLN
INTRAMUSCULAR | Status: AC
  Filled 2011-05-15: qty 4

## 2011-05-15 MED ORDER — FENTANYL NICU IV SYRINGE 50 MCG/ML
INJECTION | INTRAMUSCULAR | Status: DC | PRN
  Administered 2011-05-15 (×4): 25 ug via INTRAVENOUS

## 2011-05-15 MED ORDER — DIPHENHYDRAMINE HCL 50 MG/ML IJ SOLN
INTRAMUSCULAR | Status: DC | PRN
  Administered 2011-05-15: 25 mg via INTRAVENOUS

## 2011-05-15 MED ORDER — DIPHENHYDRAMINE HCL 50 MG/ML IJ SOLN
INTRAMUSCULAR | Status: AC
  Filled 2011-05-15: qty 1

## 2011-05-15 NOTE — Brief Op Note (Signed)
  Please see [facility] note dated 05/15/2011.  

## 2011-05-15 NOTE — H&P (Signed)
Patient interval history reviewed.  Patient examined again.  There has been no change from documented H/P dated 05/06/2011 (scanned into chart from our office) except as documented above

## 2011-05-16 ENCOUNTER — Encounter (HOSPITAL_COMMUNITY): Payer: Self-pay

## 2011-05-27 ENCOUNTER — Encounter (HOSPITAL_COMMUNITY): Payer: Self-pay | Admitting: Gastroenterology

## 2011-06-03 ENCOUNTER — Emergency Department (HOSPITAL_COMMUNITY)
Admission: EM | Admit: 2011-06-03 | Discharge: 2011-06-03 | Disposition: A | Discharge status: Home / Self Care | Payer: Self-pay | Source: Home / Self Care | Attending: Emergency Medicine | Admitting: Emergency Medicine

## 2011-06-03 ENCOUNTER — Encounter (HOSPITAL_COMMUNITY): Payer: Self-pay

## 2011-06-03 DIAGNOSIS — R05 Cough: Secondary | ICD-10-CM

## 2011-06-03 MED ORDER — FLUTICASONE PROPIONATE 50 MCG/ACT NA SUSP: 2.0000 | Freq: Every day | NASAL | Status: DC

## 2011-06-03 MED ORDER — PANTOPRAZOLE SODIUM 40 MG PO TBEC: 40.0000 mg | DELAYED_RELEASE_TABLET | Freq: Every day | ORAL | Status: DC

## 2011-06-03 MED ORDER — BENZONATATE 100 MG PO CAPS: 100.0000 mg | ORAL_CAPSULE | Freq: Three times a day (TID) | ORAL | Status: AC

## 2011-06-03 NOTE — ED Provider Notes (Signed)
History     CSN: [id number] Arrival date & time: 06/03/2011 11:56 AM   First MD Initiated Contact with Patient 06/03/11 1140      Chief Complaint  Patient presents with  . Cough    HPI Comments: Pt with nonproductive cough x 1 week. States has some rhinorrhea, postnasal drip, but also irritated throat, irritation with swallowing. States things taste differently since this started. No CP, SOB, wheeze, bodyaches, malaise.  No aggravating factors. No exposure to pulmonary irritants. Able to sleep throught night w/o coughing. Was rx'd nexium by GI after endoscopy/colonoscopy on 11/15 but was unable to afford it. Is not taking any other PPI/H2.   Patient is a 45 y.o. female presenting with cough. The history is provided by the patient and medical records.  Cough This is a new problem. The current episode started more than 1 week ago. The problem occurs every few hours. The problem has not changed since onset.The cough is non-productive. There has been no fever. Associated symptoms include rhinorrhea and sore throat. Pertinent negatives include no chest pain, no chills, no sweats, no weight loss, no ear congestion, no ear pain, no headaches, no myalgias, no shortness of breath, no wheezing and no eye redness. Treatments tried: otc cold/cough meds, motrin. The treatment provided mild relief. She is not a smoker.    Past Medical History  Diagnosis Date  . Abdominal pain     Diffuse mild  . C. difficile colitis [id number]    Treated w PO Metronidazole  . Constipation, chronic   . GERD (gastroesophageal reflux disease)     Past Surgical History  Procedure Date  . Hemorrhoid surgery 1990  . Cesarean section 1990  . Cholecystectomy 03/13/2011    lap single site  . Esophagogastroduodenoscopy 05/15/2011    Procedure: ESOPHAGOGASTRODUODENOSCOPY (EGD);  Surgeon: Freddy Jaksch, MD;  Location: [facility];  Service: Endoscopy;  Laterality: N/A;  . Colonoscopy 05/15/2011    Procedure: COLONOSCOPY;   Surgeon: Freddy Jaksch, MD;  Location: [facility];  Service: Endoscopy;  Laterality: N/A;    History reviewed. No pertinent family history.  History  Substance Use Topics  . Smoking status: Never Smoker   . Smokeless tobacco: Not on file  . Alcohol Use: No    OB History    Grav Para Term Preterm Abortions TAB SAB Ect Mult Living                  Review of Systems  Constitutional: Negative for chills and weight loss.  HENT: Positive for sore throat, rhinorrhea and postnasal drip. Negative for ear pain and trouble swallowing.   Eyes: Negative for redness.  Respiratory: Positive for cough. Negative for choking, chest tightness, shortness of breath, wheezing and stridor.   Cardiovascular: Negative for chest pain.  Gastrointestinal: Negative for nausea and vomiting.  Musculoskeletal: Negative for myalgias.  Neurological: Negative for headaches.    Allergies  Review of patient's allergies indicates no known allergies.  Home Medications   Current Outpatient Rx  Name Route Sig Dispense Refill  . IBUPROFEN 400 MG PO TABS Oral Take 400 mg by mouth every 6 (six) hours as needed.      Marland Kitchen FLUTICASONE PROPIONATE 50 MCG/ACT NA SUSP Nasal Place 2 sprays into the nose daily. 16 g 2  . PANTOPRAZOLE SODIUM 40 MG PO TBEC Oral Take 1 tablet (40 mg total) by mouth daily. 20 tablet 0  . POLYETHYLENE GLYCOL 3350 PO PACK Oral Take 17 g by mouth daily.  BP 128/82  Pulse 67  Temp(Src) 99.7 F (37.6 C) (Oral)  Resp 20  SpO2 99%  LMP 05/10/2011  Physical Exam  Nursing note and vitals reviewed. Constitutional: She is oriented to person, place, and time. She appears well-developed and well-nourished. No distress.  HENT:  Head: Normocephalic and atraumatic.  Right Ear: Tympanic membrane normal.  Left Ear: Tympanic membrane normal.  Nose: Rhinorrhea present. No mucosal edema.  Mouth/Throat: Uvula is midline.       Red cobblestoned irritated throat.   Eyes: EOM are normal. Pupils  are equal, round, and reactive to light.  Neck: Normal range of motion.  Cardiovascular: Normal rate and regular rhythm.   Pulmonary/Chest: Effort normal and breath sounds normal. No respiratory distress. She has no wheezes. She has no rales. She exhibits no tenderness.  Abdominal: Soft. Bowel sounds are normal. She exhibits no distension.  Musculoskeletal: Normal range of motion.  Lymphadenopathy:    She has no cervical adenopathy.  Neurological: She is alert and oriented to person, place, and time.  Skin: Skin is warm and dry.  Psychiatric: She has a normal mood and affect. Her behavior is normal. Judgment and thought content normal.    ED Course  Procedures (including critical care time)  Labs Reviewed - No data to display No results found.   1. Cough       MDM  Pt with nonpdroductive cough is not taking nexium as rx'd to her by GI as cannot afford it. Think cough either from reflux or postnasal drip. Does not appear to be bronchitis, PNA or other infectious cause. Will start pt on affordable PPI and flonase to cover both.   Luiz Blare, MD 06/03/11 [phone]

## 2011-06-03 NOTE — ED Notes (Signed)
C/o cough for past few days; minimal relief w OTC meds

## 2013-04-25 ENCOUNTER — Ambulatory Visit: Payer: BC Managed Care – PPO

## 2013-04-25 ENCOUNTER — Ambulatory Visit (INDEPENDENT_AMBULATORY_CARE_PROVIDER_SITE_OTHER): Payer: BC Managed Care – PPO | Admitting: Emergency Medicine

## 2013-04-25 VITALS — BP 110/70 | HR 63 | Temp 98.2°F | Resp 18 | Ht 63.0 in | Wt 177.0 lb

## 2013-04-25 DIAGNOSIS — R079 Chest pain, unspecified: Secondary | ICD-10-CM

## 2013-04-25 DIAGNOSIS — K219 Gastro-esophageal reflux disease without esophagitis: Secondary | ICD-10-CM

## 2013-04-25 LAB — POCT CBC
HCT, POC: 40.7 % (ref 37.7–47.9)
Hemoglobin: 12.9 g/dL (ref 12.2–16.2)
Lymph, poc: 2.6 (ref 0.6–3.4)
MCH, POC: 29.3 pg (ref 27–31.2)
MCV: 92.4 fL (ref 80–97)
RBC: 4.41 M/uL (ref 4.04–5.48)
WBC: 6.1 10*3/uL (ref 4.6–10.2)

## 2013-04-25 MED ORDER — LANSOPRAZOLE 30 MG PO CPDR: 30.0000 mg | DELAYED_RELEASE_CAPSULE | Freq: Every day | ORAL | Status: DC

## 2013-04-25 MED ORDER — SUCRALFATE 1 G PO TABS: ORAL_TABLET | ORAL | Status: DC

## 2013-04-25 MED ORDER — TRAMADOL HCL 50 MG PO TABS: 50.0000 mg | ORAL_TABLET | Freq: Three times a day (TID) | ORAL | Status: DC | PRN

## 2013-04-25 NOTE — Progress Notes (Signed)
[facility] [address] [place] [address] [phone]  Date:  04/25/2013   Name:  Angel Bennett   DOB:  Apr 10, 1966   MRN:  [id number]  PCP:  No Pcp Per Pt    Chief Complaint: Chest Pain   History of Present Illness:  Angel Bennett is a 47 y.o. very pleasant female patient who presents with the following:  Ill with chest pain that was intermittent prior to the past week and has been constant all this week.  Says is a squeezing pressure, sometimes like a rock.  Never goes away.  No fever or chills.  Occasional wheezing and exertional shortness of breath.  No nausea or vomiting.  No stool change, cough, stool change or coryza.  Denies travel or immobilization or surgery.  No hemoptysis.  Non smoker.  No improvement with over the counter medications or other home remedies. Denies other complaint or health concern today.   Patient Active Problem List   Diagnosis Date Noted  . Chronic cholecystitis 03/06/2011  . GERD (gastroesophageal reflux disease) 03/06/2011  . Constipation, chronic 03/06/2011    Past Medical History  Diagnosis Date  . Abdominal pain     Diffuse mild  . C. difficile colitis [id number]    Treated w PO Metronidazole  . Constipation, chronic   . GERD (gastroesophageal reflux disease)     Past Surgical History  Procedure Laterality Date  . Hemorrhoid surgery  1990  . Cesarean section  1990  . Cholecystectomy  03/13/2011    lap single site  . Esophagogastroduodenoscopy  05/15/2011    Procedure: ESOPHAGOGASTRODUODENOSCOPY (EGD);  Surgeon: Freddy Jaksch, MD;  Location: [facility];  Service: Endoscopy;  Laterality: N/A;  . Colonoscopy  05/15/2011    Procedure: COLONOSCOPY;  Surgeon: Freddy Jaksch, MD;  Location: [facility];  Service: Endoscopy;  Laterality: N/A;    History  Substance Use Topics  . Smoking status: Never Smoker   . Smokeless tobacco: Not on file  . Alcohol Use: No    History reviewed. No pertinent family  history.  No Known Allergies  Medication list has been reviewed and updated.  Current Outpatient Prescriptions on File Prior to Visit  Medication Sig Dispense Refill  . fluticasone (FLONASE) 50 MCG/ACT nasal spray Place 2 sprays into the nose daily.  16 g  2  . pantoprazole (PROTONIX) 40 MG tablet Take 1 tablet (40 mg total) by mouth daily.  20 tablet  0   No current facility-administered medications on file prior to visit.    Review of Systems:  As per HPI, otherwise negative.    Physical Examination: Filed Vitals:   04/25/13 1649  BP: 110/70  Pulse: 63  Temp: 98.2 F (36.8 C)  Resp: 18   Filed Vitals:   04/25/13 1649  Height: 5\' 3"  (1.6 m)  Weight: 177 lb (80.287 kg)   Body mass index is 31.36 kg/(m^2). Ideal Body Weight: Weight in (lb) to have BMI = 25: 140.8  GEN: WDWN, NAD, Non-toxic, A & O x 3 HEENT: Atraumatic, Normocephalic. Neck supple. No masses, No LAD. Ears and Nose: No external deformity. CV: RRR, No M/G/R. No JVD. No thrill. No extra heart sounds. PULM: CTA B, no wheezes, crackles, rhonchi. No retractions. No resp. distress. No accessory muscle use. ABD: S, NT, ND, +BS. No rebound. No HSM. EXTR: No c/c/e NEURO Normal gait.  PSYCH: Normally interactive. Conversant. Not depressed or anxious appearing.  Calm demeanor.  Assessment and Plan: Chest pain GERD vs musculoskeletal  Tramadol Prevacid carafate   Signed,  Phillips Odor, MD   [facility] reading (PRIMARY) by  Dr. Dareen Piano. Negative chest.

## 2013-04-25 NOTE — Patient Instructions (Signed)
Gastroesophageal Reflux Disease, Adult  Gastroesophageal reflux disease (GERD) happens when acid from your stomach flows up into the esophagus. When acid comes in contact with the esophagus, the acid causes soreness (inflammation) in the esophagus. Over time, GERD may create small holes (ulcers) in the lining of the esophagus.  CAUSES   · Increased body weight. This puts pressure on the stomach, making acid rise from the stomach into the esophagus.  · Smoking. This increases acid production in the stomach.  · Drinking alcohol. This causes decreased pressure in the lower esophageal sphincter (valve or ring of muscle between the esophagus and stomach), allowing acid from the stomach into the esophagus.  · Late evening meals and a full stomach. This increases pressure and acid production in the stomach.  · A malformed lower esophageal sphincter.  Sometimes, no cause is found.  SYMPTOMS   · Burning pain in the lower part of the mid-chest behind the breastbone and in the mid-stomach area. This may occur twice a week or more often.  · Trouble swallowing.  · Sore throat.  · Dry cough.  · Asthma-like symptoms including chest tightness, shortness of breath, or wheezing.  DIAGNOSIS   Your caregiver may be able to diagnose GERD based on your symptoms. In some cases, X-rays and other tests may be done to check for complications or to check the condition of your stomach and esophagus.  TREATMENT   Your caregiver may recommend over-the-counter or prescription medicines to help decrease acid production. Ask your caregiver before starting or adding any new medicines.   HOME CARE INSTRUCTIONS   · Change the factors that you can control. Ask your caregiver for guidance concerning weight loss, quitting smoking, and alcohol consumption.  · Avoid foods and drinks that make your symptoms worse, such as:  · Caffeine or alcoholic drinks.  · Chocolate.  · Peppermint or mint flavorings.  · Garlic and onions.  · Spicy foods.  · Citrus fruits,  such as oranges, lemons, or limes.  · Tomato-based foods such as sauce, chili, salsa, and pizza.  · Fried and fatty foods.  · Avoid lying down for the 3 hours prior to your bedtime or prior to taking a nap.  · Eat small, frequent meals instead of large meals.  · Wear loose-fitting clothing. Do not wear anything tight around your waist that causes pressure on your stomach.  · Raise the head of your bed 6 to 8 inches with wood blocks to help you sleep. Extra pillows will not help.  · Only take over-the-counter or prescription medicines for pain, discomfort, or fever as directed by your caregiver.  · Do not take aspirin, ibuprofen, or other nonsteroidal anti-inflammatory drugs (NSAIDs).  SEEK IMMEDIATE MEDICAL CARE IF:   · You have pain in your arms, neck, jaw, teeth, or back.  · Your pain increases or changes in intensity or duration.  · You develop nausea, vomiting, or sweating (diaphoresis).  · You develop shortness of breath, or you faint.  · Your vomit is green, yellow, black, or looks like coffee grounds or blood.  · Your stool is red, bloody, or black.  These symptoms could be signs of other problems, such as heart disease, gastric bleeding, or esophageal bleeding.  MAKE SURE YOU:   · Understand these instructions.  · Will watch your condition.  · Will get help right away if you are not doing well or get worse.  Document Released: 03/27/2005 Document Revised: 09/09/2011 Document Reviewed: 01/04/2011  ExitCare® Patient   Information ©2014 [employer].

## 2013-04-26 LAB — COMPREHENSIVE METABOLIC PANEL
ALT: <8 U/L (ref 0–35)
BUN: 8 mg/dL (ref 6–23)
CO2: 27 mEq/L (ref 19–32)
Calcium: 8.9 mg/dL (ref 8.4–10.5)
Chloride: 102 mEq/L (ref 96–112)
Creat: 0.69 mg/dL (ref 0.50–1.10)
Glucose, Bld: 80 mg/dL (ref 70–99)

## 2013-04-27 ENCOUNTER — Other Ambulatory Visit: Payer: Self-pay | Admitting: Emergency Medicine

## 2013-04-27 LAB — H. PYLORI ANTIBODY, IGG: H Pylori IgG: 1.34 {ISR} — ABNORMAL HIGH

## 2013-04-27 MED ORDER — AMOXICILL-CLARITHRO-LANSOPRAZ PO MISC: Freq: Two times a day (BID) | ORAL | Status: DC

## 2013-04-28 ENCOUNTER — Inpatient Hospital Stay (HOSPITAL_COMMUNITY)
Admission: EM | Admit: 2013-04-28 | Discharge: 2013-04-29 | DRG: 390 | Disposition: A | Discharge status: Home / Self Care | Payer: BC Managed Care – PPO | Attending: General Surgery | Admitting: General Surgery

## 2013-04-28 ENCOUNTER — Ambulatory Visit (INDEPENDENT_AMBULATORY_CARE_PROVIDER_SITE_OTHER): Payer: BC Managed Care – PPO | Admitting: Emergency Medicine

## 2013-04-28 ENCOUNTER — Emergency Department (HOSPITAL_COMMUNITY): Payer: BC Managed Care – PPO

## 2013-04-28 ENCOUNTER — Ambulatory Visit: Payer: BC Managed Care – PPO

## 2013-04-28 ENCOUNTER — Encounter (HOSPITAL_COMMUNITY): Payer: Self-pay | Admitting: Emergency Medicine

## 2013-04-28 ENCOUNTER — Telehealth: Payer: Self-pay | Admitting: Radiology

## 2013-04-28 VITALS — BP 100/70 | HR 89 | Temp 98.5°F | Resp 18 | Ht 63.0 in | Wt 175.0 lb

## 2013-04-28 DIAGNOSIS — G8929 Other chronic pain: Secondary | ICD-10-CM | POA: Diagnosis present

## 2013-04-28 DIAGNOSIS — K56600 Partial intestinal obstruction, unspecified as to cause: Secondary | ICD-10-CM

## 2013-04-28 DIAGNOSIS — K5900 Constipation, unspecified: Secondary | ICD-10-CM

## 2013-04-28 DIAGNOSIS — Z8619 Personal history of other infectious and parasitic diseases: Secondary | ICD-10-CM

## 2013-04-28 DIAGNOSIS — R109 Unspecified abdominal pain: Secondary | ICD-10-CM

## 2013-04-28 DIAGNOSIS — R079 Chest pain, unspecified: Secondary | ICD-10-CM

## 2013-04-28 DIAGNOSIS — K219 Gastro-esophageal reflux disease without esophagitis: Secondary | ICD-10-CM | POA: Diagnosis present

## 2013-04-28 DIAGNOSIS — K56609 Unspecified intestinal obstruction, unspecified as to partial versus complete obstruction: Principal | ICD-10-CM | POA: Diagnosis present

## 2013-04-28 HISTORY — DX: Partial intestinal obstruction, unspecified as to cause: K56.600

## 2013-04-28 HISTORY — DX: Tinnitus, unspecified ear: H93.19

## 2013-04-28 LAB — CREATININE, SERUM
Creatinine, Ser: 0.62 mg/dL (ref 0.50–1.10)
GFR calc Af Amer: >90 mL/min (ref >90)
GFR calc non Af Amer: >90 mL/min (ref >90)

## 2013-04-28 LAB — POCT UA - MICROSCOPIC ONLY: Casts, Ur, LPF, POC: NEGATIVE

## 2013-04-28 LAB — CBC
HCT: 41.4 % (ref 36.0–46.0)
Hemoglobin: 14.1 g/dL (ref 12.0–15.0)
MCH: 29.1 pg (ref 26.0–34.0)
MCHC: 34.1 g/dL (ref 30.0–36.0)
Platelets: 241 10*3/uL (ref 150–400)
WBC: 5.5 10*3/uL (ref 4.0–10.5)

## 2013-04-28 LAB — POCT URINALYSIS DIPSTICK
Glucose, UA: NEGATIVE
Nitrite, UA: NEGATIVE
Spec Grav, UA: >=1.03
Urobilinogen, UA: 1

## 2013-04-28 LAB — POCT URINE PREGNANCY: Preg Test, Ur: NEGATIVE

## 2013-04-28 MED ORDER — DIPHENHYDRAMINE HCL 12.5 MG/5ML PO ELIX: 12.5000 mg | ORAL_SOLUTION | Freq: Four times a day (QID) | ORAL | Status: DC | PRN

## 2013-04-28 MED ORDER — KCL IN DEXTROSE-NACL 20-5-0.45 MEQ/L-%-% IV SOLN
INTRAVENOUS | Status: DC
  Administered 2013-04-28 – 2013-04-29 (×2): via INTRAVENOUS
  Filled 2013-04-28 (×4): qty 1000

## 2013-04-28 MED ORDER — IOHEXOL 300 MG/ML  SOLN
100.0000 mL | Freq: Once | INTRAMUSCULAR | Status: AC | PRN
  Administered 2013-04-28: 100 mL via INTRAVENOUS

## 2013-04-28 MED ORDER — ENOXAPARIN SODIUM 40 MG/0.4ML ~~LOC~~ SOLN
40.0000 mg | SUBCUTANEOUS | Status: DC
  Administered 2013-04-28: 40 mg via SUBCUTANEOUS
  Filled 2013-04-28 (×2): qty 0.4

## 2013-04-28 MED ORDER — DIPHENHYDRAMINE HCL 50 MG/ML IJ SOLN: 12.5000 mg | Freq: Four times a day (QID) | INTRAMUSCULAR | Status: DC | PRN

## 2013-04-28 MED ORDER — MORPHINE SULFATE 2 MG/ML IJ SOLN
1.0000 mg | INTRAMUSCULAR | Status: DC | PRN
  Administered 2013-04-28: 2 mg via INTRAVENOUS
  Filled 2013-04-28: qty 1

## 2013-04-28 MED ORDER — BISACODYL 10 MG RE SUPP
10.0000 mg | Freq: Once | RECTAL | Status: AC
  Administered 2013-04-29: 10 mg via RECTAL
  Filled 2013-04-28: qty 1

## 2013-04-28 MED ORDER — ONDANSETRON HCL 4 MG/2ML IJ SOLN: 4.0000 mg | Freq: Four times a day (QID) | INTRAMUSCULAR | Status: DC | PRN

## 2013-04-28 MED ORDER — PANTOPRAZOLE SODIUM 40 MG PO TBEC
40.0000 mg | DELAYED_RELEASE_TABLET | Freq: Every day | ORAL | Status: DC
  Administered 2013-04-29: 40 mg via ORAL
  Filled 2013-04-28: qty 1

## 2013-04-28 NOTE — Patient Instructions (Signed)
Small Bowel Obstruction A small bowel obstruction is a blockage (obstruction) of the small intestine (small bowel). The small bowel is a long, slender tube that connects the stomach to the colon. Its job is to absorb nutrients from the fluids and foods you consume into the bloodstream.  CAUSES  There are many causes of intestinal blockage. The most common ones include:  Hernias. This is a more common cause in children than adults.  Inflammatory bowel disease (enteritis and colitis).  Twisting of the bowel (volvulus).  Tumors.  Scar tissue (adhesions) from previous surgery or radiation treatment.  Recent surgery. This may cause an acute small bowel obstruction called an ileus. SYMPTOMS   Abdominal pain. This may be dull cramps or sharp pain. It may occur in one area or may be present in the entire abdomen. Pain can range from mild to severe, depending on the degree of obstruction.  Nausea and vomiting. Vomit may be greenish or yellow bile color.  Distended or swollen stomach. Abdominal bloating is a common symptom.  Constipation.  Lack of passing gas.  Frequent belching.  Diarrhea. This may occur if runny stool is able to leak around the obstruction. DIAGNOSIS  Your caregiver can usually diagnose small bowel obstruction by taking a history, doing a physical exam, and taking X-rays. If the cause is unclear, a CT scan (computerized tomography) of your abdomen and pelvis may be needed. TREATMENT  Treatment of the blockage depends on the cause and how bad the problem is.   Sometimes, the obstruction improves with bed rest and intravenous (IV) fluids.  Resting the bowel is very important. This means following a simple diet. Sometimes, a clear liquid diet may be required for several days.  Sometimes, a small tube (nasogastric tube) is placed into the stomach to decompress the bowel. When the bowel is blocked, it usually swells up like a balloon filled with air and fluids.  Decompression means that the air and fluids are removed by suction through that tube. This can help with pain, discomfort, and nausea. It can also help the obstruction resolve faster.  Surgery may be required if other treatments do not work. Bowel obstruction from a hernia may require early surgery and can be an emergency procedure. Adhesions that cause frequent or severe obstructions may also require surgery. HOME CARE INSTRUCTIONS If your bowel obstruction is only partial or incomplete, you may be allowed to go home.  Get plenty of rest.  Follow your diet as directed by your caregiver.  Only consume clear liquids until your condition improves.  Avoid solid foods as instructed. SEEK IMMEDIATE MEDICAL CARE IF:  You have increased pain or cramping.  You vomit blood.  You have uncontrolled vomiting or nausea.  You cannot drink fluids due to vomiting or pain.  You develop confusion.  You begin feeling very dry or thirsty (dehydrated).  You have severe bloating.  You have chills.  You have a fever.  You feel extremely weak or you faint. MAKE SURE YOU:  Understand these instructions.  Will watch your condition.  Will get help right away if you are not doing well or get worse. Document Released: 09/03/2005 Document Revised: 09/09/2011 Document Reviewed: 08/31/2010 ExitCare Patient Information 2014 [employer].  

## 2013-04-28 NOTE — Telephone Encounter (Signed)
Disregard note, please. Patient decided to be seen in recheck.

## 2013-04-28 NOTE — Progress Notes (Signed)
Discussed admission status with Dr. Johna Sheriff.

## 2013-04-28 NOTE — ED Notes (Signed)
Pt sent from urgent care for "blockage", states she's had abdominal pain x 1 week.

## 2013-04-28 NOTE — Telephone Encounter (Signed)
You called with question about prev pack, it is documented in the labs patient has had prev pack called in, however this was not done, I have tried to call you back, but could not get this

## 2013-04-28 NOTE — Progress Notes (Signed)
Utilization Review completed.  Cielle Aguila RN CM  

## 2013-04-28 NOTE — Progress Notes (Signed)
Pt does not want to sign up for My Chart at current time. Information provided. Briscoe Burns BSN, RN-BC Admissions RN  04/28/2013 4:09 PM

## 2013-04-28 NOTE — Telephone Encounter (Signed)
Patient wants to know if you will extend her work note an additional four days. Please advise.

## 2013-04-28 NOTE — Progress Notes (Addendum)
Subjective:    Patient ID: Angel Bennett, female    DOB: 12/23/1965, 47 y.o.   MRN: [id number]  HPI This chart was scribed for Angel Bennett Nichelson-MD, by Ladona Ridgel Day, Scribe. This patient was seen in room 2 and the patient's care was started at 11:41 AM.  HPI Comments: [name] is a 47 y.o. female who [facility] follow up on intermittent pressure like chest pain, onset several weeks ago which she states is still present today. She was seen here 04/25/13 for same problem. She reports hx of GERD and states her CP feels somewhat similar to flare ups of GERD. She is followed by Dr. Dulce Sellar for her stomach issues and had a normal endoscopy in 2012. She states has not seen him in one year. She denies any abdominal pain.   She states her symptoms are worse after eating and she often feels bloated up to 2-3 days after eating. She has a BM every 2 or 3 days and sometimes takes a stool softener.  She also c/o fatigue and intermittent SOB.   She has had a cholecystectomy which did no improve this problem  LNMP 17 October. - She has 2 children.   Cholecystectomy, did no improve her sx. Previous C-section  Past Medical History  Diagnosis Date  . Abdominal pain     Diffuse mild  . C. difficile colitis [id number]    Treated w PO Metronidazole  . Constipation, chronic   . GERD (gastroesophageal reflux disease)     Past Surgical History  Procedure Laterality Date  . Hemorrhoid surgery  1990  . Cesarean section  1990  . Cholecystectomy  03/13/2011    lap single site  . Esophagogastroduodenoscopy  05/15/2011    Procedure: ESOPHAGOGASTRODUODENOSCOPY (EGD);  Surgeon: Freddy Jaksch, MD;  Location: [facility];  Service: Endoscopy;  Laterality: N/A;  . Colonoscopy  05/15/2011    Procedure: COLONOSCOPY;  Surgeon: Freddy Jaksch, MD;  Location: [facility];  Service: Endoscopy;  Laterality: N/A;    History reviewed. No pertinent family history.  History    Social History  . Marital Status: Married    Spouse Name: N/A    Number of Children: N/A  . Years of Education: N/A   Occupational History  . Not on file.   Social History Main Topics  . Smoking status: Never Smoker   . Smokeless tobacco: Not on file  . Alcohol Use: No  . Drug Use: No  . Sexual Activity: Yes    Birth Control/ Protection: None   Other Topics Concern  . Not on file   Social History Narrative  . No narrative on file    No Known Allergies  Patient Active Problem List   Diagnosis Date Noted  . Chronic cholecystitis 03/06/2011  . GERD (gastroesophageal reflux disease) 03/06/2011  . Constipation, chronic 03/06/2011    Results for orders placed in visit on 04/25/13  COMPREHENSIVE METABOLIC PANEL      Result Value Range   Sodium 137  135 - 145 mEq/L   Potassium 3.7  3.5 - 5.3 mEq/L   Chloride 102  96 - 112 mEq/L   CO2 27  19 - 32 mEq/L   Glucose, Bld 80  70 - 99 mg/dL   BUN 8  6 - 23 mg/dL   Creat 1.47  8.29 - 5.62 mg/dL   Total Bilirubin 0.2 (*) 0.3 - 1.2 mg/dL   Alkaline Phosphatase 113  39 -  117 U/L   AST 16  0 - 37 U/L   ALT <8  0 - 35 U/L   Total Protein 7.5  6.0 - 8.3 g/dL   Albumin 3.9  3.5 - 5.2 g/dL   Calcium 8.9  8.4 - 16.1 mg/dL  H. PYLORI ANTIBODY, IGG      Result Value Range   H Pylori IgG 1.34 (*)   POCT CBC      Result Value Range   WBC 6.1  4.6 - 10.2 K/uL   Lymph, poc 2.6  0.6 - 3.4   POC LYMPH PERCENT 42.5  10 - 50 %L   MID (cbc) 0.5  0 - 0.9   POC MID % 8.4  0 - 12 %M   POC Granulocyte 3.0  2 - 6.9   Granulocyte percent 49.1  37 - 80 %G   RBC 4.41  4.04 - 5.48 M/uL   Hemoglobin 12.9  12.2 - 16.2 g/dL   HCT, POC 09.6  04.5 - 47.9 %   MCV 92.4  80 - 97 fL   MCH, POC 29.3  27 - 31.2 pg   MCHC 31.7 (*) 31.8 - 35.4 g/dL   RDW, POC 40.9     Platelet Count, POC 234  142 - 424 K/uL   MPV 9.8  0 - 99.8 fL    No diagnosis found.  No orders of the defined types were placed in this encounter.     Review of Systems   Constitutional: Positive for fatigue. Negative for fever.  Respiratory: Positive for shortness of breath. Negative for cough.   Cardiovascular: Positive for chest pain.  Gastrointestinal: Negative for vomiting, abdominal pain and diarrhea.       Objective:   Physical Exam  Nursing note and vitals reviewed. Constitutional: She is oriented to person, place, and time. She appears well-developed and well-nourished. No distress.  HENT:  Head: Normocephalic and atraumatic.  Neck: Neck supple. No tracheal deviation present.  Cardiovascular: Normal rate.   Pulmonary/Chest: Effort normal. No respiratory distress.  Musculoskeletal: Normal range of motion.  Neurological: She is alert and oriented to person, place, and time.  Skin: Skin is warm and dry.  Psychiatric: She has a normal mood and affect. Her behavior is normal.   abdominal exam bowel sounds are present. There are healed incisions. There is significant tenderness primarily on the left side of the abdomen Results for orders placed in visit on 04/28/13  POCT UA - MICROSCOPIC ONLY      Result Value Range   WBC, Ur, HPF, POC 0-1     RBC, urine, microscopic 1-3     Bacteria, U Microscopic 1+     Mucus, UA neg     Epithelial cells, urine per micros 3-5     Crystals, Ur, HPF, POC neg     Casts, Ur, LPF, POC neg     Yeast, UA neg    POCT URINALYSIS DIPSTICK      Result Value Range   Color, UA yellow     Clarity, UA cloudy     Glucose, UA neg     Bilirubin, UA neg     Ketones, UA neg     Spec Grav, UA >=1.030     Blood, UA neg     pH, UA 7.0     Protein, UA 30     Urobilinogen, UA 1.0     Nitrite, UA neg     Leukocytes, UA Negative  POCT URINE PREGNANCY      Result Value Range   Preg Test, Ur Negative     [facility] reading (PRIMARY) by  Dr. Cleta Alberts x-ray shows air-fluid levels left side of the abdomen suspicious for a partial small bowel obstruction        Assessment & Plan:  Patient's physical exam x-ray consistent with a  partial small bowel obstruction. Will discuss with the general surgeon on call.

## 2013-04-28 NOTE — H&P (Signed)
Angel Bennett February 15, 1966  [id number].   Primary Care MD: Dr. Lesle Chris Chief Complaint/Reason for Consult: abdominal pain, ? Early PSBO HPI: This is a 47 yo [place] female who has chronic abdominal pain.  She states she has had this for at least 2 years if not longer.  She was found to have H. Pylori on EGD 2 years ago and was treated for that.  She has seen GI in the past (Dr. Dulce Sellar) and had said EGD and colonoscopy which were both otherwise negative.  She was then referred to [facility] for evaluation of her gallbladder.  This was removed by Dr. Michaell Cowing.  Unfortunately, this did not help her continued abdominal pain.  Recently, she states her pain radiates up into her chest and is burning in natures.  She admits to early satiety, bloating, and an inability to pass gas or have a BM for the last two days.  She states she is only eating liquids and her pain is worse.  She states she does take some over the counter meds for constipation, but is unable to tell me what they are.  She went to see her PCP today for further evaluation.  (of note, she saw his partner 3 days ago for this same thing and was found to be H.pylori + on blood work, but unable to determine if this is positive from old infection or acute new infection.  She was started on a prev pack anyway)  Dr. Cleta Alberts did any abdominal x-ray today which revealed a possible early PSBO.  We have been contacted for admission.  ROS: Please see HPI, otherwise the patient admits to weakness, SOB when laying on her right side, and some burning chest pain.  All other systems are negative  History reviewed. No pertinent family history.  Past Medical History  Diagnosis Date  . Abdominal pain     Diffuse mild  . C. difficile colitis [id number]    Treated w PO Metronidazole  . Constipation, chronic   . GERD (gastroesophageal reflux disease)     Past Surgical History  Procedure Laterality Date  . Hemorrhoid surgery  1990  . Cesarean section  1990  .  Cholecystectomy  03/13/2011    lap single site  . Esophagogastroduodenoscopy  05/15/2011    Procedure: ESOPHAGOGASTRODUODENOSCOPY (EGD);  Surgeon: Freddy Jaksch, MD;  Location: [facility];  Service: Endoscopy;  Laterality: N/A;  . Colonoscopy  05/15/2011    Procedure: COLONOSCOPY;  Surgeon: Freddy Jaksch, MD;  Location: [facility];  Service: Endoscopy;  Laterality: N/A;    Social History:  reports that she has never smoked. She does not have any smokeless tobacco history on file. She reports that she does not drink alcohol or use illicit drugs.  Allergies: No Known Allergies   (Not in a hospital admission)  Blood pressure 112/79, pulse 77, temperature 97.7 F (36.5 C), temperature source Oral, resp. rate 16, last menstrual period 04/16/2013, SpO2 98.00%. Physical Exam: General: pleasant, obese [place] female who is laying in bed in NAD HEENT: head is normocephalic, atraumatic.  Sclera are noninjected.  PERRL.  Ears and nose without any masses or lesions.  Mouth is pink and moist Heart: regular, rate, and rhythm.  Normal s1,s2. No obvious murmurs, gallops, or rubs noted.  Palpable radial and pedal pulses bilaterally Lungs: CTAB, no wheezes, rhonchi, or rales noted.  Respiratory effort nonlabored Abd: soft, diffusely tender, no one spot more than another, ND, +BS, no masses, hernias, or organomegaly.  No guarding,  rebound, or peritoneal signs MS: all 4 extremities are symmetrical with no cyanosis, clubbing, or edema. Skin: warm and dry with no masses, lesions, or rashes Psych: A&Ox3 with an appropriate affect.    Results for orders placed in visit on 04/28/13 (from the past 48 hour(s))  POCT UA - MICROSCOPIC ONLY     Status: Abnormal   Collection Time    04/28/13 12:04 PM      Result Value Range   WBC, Ur, HPF, POC 0-1     RBC, urine, microscopic 1-3     Bacteria, U Microscopic 1+     Mucus, UA neg     Epithelial cells, urine per micros 3-5     Crystals, Ur, HPF, POC neg      Casts, Ur, LPF, POC neg     Yeast, UA neg    POCT URINALYSIS DIPSTICK     Status: None   Collection Time    04/28/13 12:04 PM      Result Value Range   Color, UA yellow     Clarity, UA cloudy     Glucose, UA neg     Bilirubin, UA neg     Ketones, UA neg     Spec Grav, UA >=1.030     Blood, UA neg     pH, UA 7.0     Protein, UA 30     Urobilinogen, UA 1.0     Nitrite, UA neg     Leukocytes, UA Negative    POCT URINE PREGNANCY     Status: Normal   Collection Time    04/28/13 12:04 PM      Result Value Range   Preg Test, Ur Negative     Dg Abd Acute W/chest  04/28/2013   CLINICAL DATA:  Abdominal pain  EXAM: ACUTE ABDOMEN SERIES (ABDOMEN 2 VIEW & CHEST 1 VIEW)  COMPARISON:  04/25/2013  FINDINGS: Cardiomediastinal silhouette is stable. No acute infiltrate or pleural effusion. No pulmonary edema. Distended small bowel loops are noted mid abdomen highly suspicious for partial small bowel obstruction. Postcholecystectomy surgical clips are noted. No free abdominal air.  IMPRESSION: No acute disease within chest. Distended small bowel loops in mid abdomen with air-fluid levels suspicious for partial small bowel obstruction. No free abdominal air. Postcholecystectomy surgical clips are noted.   Electronically Signed   By: Natasha Mead M.D.   On: 04/28/2013 14:47       Assessment/Plan 1. Abdominal pain 2. Early satiety 3. Possible early partial SBO 4. Chronic constipation  Plan: 1. The patient may have an early PSBO, however, this is still somewhat unclear given the chronicity of her symptoms.  She has had an UGI 4 years ago that was normal, a gastric emptying studying 2 years ago that was normal, along with a normal CT scan.  She has undergone GI workup 2 years ago with Dr. Dulce Sellar with no definite source of her symptoms.  And finally a cholecystectomy, that didn't help either.  For now, we will get her admitted and order a CT enterography to better evaluate her abdomen and see if she  has a true obstruction.  If she does not she will likely need GI follow up for further evaluation and management of her chronic abdominal pain and constipation.  I will start with a dulcolax suppository to help with her constipation.  She will be initially kept NPO with IVFs and medications as needed.  We will await her scan and make further plans after  this returns.  OSBORNE,KELLY E 04/28/2013, 2:59 PM Pager: [phone] This sounds like more chronic issues than any acute obstruction.  We will try and do some workup while in the hospital and to rule out acute obstruction but her hx and exam are not very consistent with this.  GI consult.

## 2013-04-28 NOTE — ED Notes (Signed)
Dr Cleta Alberts called, pt being sent from urgent care for partial small bowel obstruction. Md spoke with PA Toniann Fail at [facility].

## 2013-04-29 ENCOUNTER — Inpatient Hospital Stay (HOSPITAL_COMMUNITY): Payer: BC Managed Care – PPO

## 2013-04-29 MED ORDER — POLYETHYLENE GLYCOL 3350 17 G PO PACK: 17.0000 g | PACK | Freq: Every day | ORAL | Status: DC

## 2013-04-29 MED ORDER — POLYETHYLENE GLYCOL 3350 17 G PO PACK
17.0000 g | PACK | Freq: Every day | ORAL | Status: DC
Start: 2013-04-29 — End: 2013-04-29
  Administered 2013-04-29: 17 g via ORAL
  Filled 2013-04-29: qty 1

## 2013-04-29 NOTE — Discharge Summary (Signed)
Patient ID: SHAWNTA SCHLEGEL MRN: [id number] DOB/AGE: Apr 22, 1966 47 y.o.  Admit date: 04/28/2013 Discharge date: 04/29/2013  Procedures: none  Consults: None  Reason for Admission: This is a 47 yo [place] female who has chronic abdominal pain. She states she has had this for at least 2 years if not longer. She was found to have H. Pylori on EGD 2 years ago and was treated for that. She has seen GI in the past (Dr. Dulce Sellar) and had said EGD and colonoscopy which were both otherwise negative. She was then referred to [facility] for evaluation of her gallbladder. This was removed by Dr. Michaell Cowing. Unfortunately, this did not help her continued abdominal pain. Recently, she states her pain radiates up into her chest and is burning in natures. She admits to early satiety, bloating, and an inability to pass gas or have a BM for the last two days. She states she is only eating liquids and her pain is worse. She states she does take some over the counter meds for constipation, but is unable to tell me what they are. She went to see her PCP today for further evaluation. (of note, she saw his partner 3 days ago for this same thing and was found to be H.pylori + on blood work, but unable to determine if this is positive from old infection or acute new infection. She was started on a prev pack anyway) Dr. Cleta Alberts did any abdominal x-ray today which revealed a possible early PSBO. We have been contacted for admission.  Admission Diagnoses:  1. Chronic abdominal pain 2. Early satiety  3. Possible early partial SBO  4. Chronic constipation  Hospital Course: The patient was admitted and a CT enterography was completed.  This did not reveal any evidence of obstruction or other source for her chronic abdominal pain and early satiety.  She was given clear liquids and was stable for dc home.  She was put on a bowel regimen to help with her chronic constipation, which may be contributing to some of her symptoms.  I have also contacted GI  to discuss her case.  They did not feel any inpatient services were required and requested she follow up as an outpatient.  She was otherwise stable for dc home.  Discharge Diagnoses:  Principal Problem:   Chronic abdominal pain Active Problems:   Partial small bowel obstruction   History of Helicobacter pylori infection   Discharge Medications:   Medication List         lansoprazole 30 MG capsule  Commonly known as:  PREVACID  Take 1 capsule (30 mg total) by mouth daily.     pantoprazole 40 MG tablet  Commonly known as:  PROTONIX  Take 1 tablet (40 mg total) by mouth daily.     polyethylene glycol packet  Commonly known as:  MIRALAX / GLYCOLAX  Take 17 g by mouth daily.        Discharge Instructions:     Follow-up Information   Follow up with Freddy Jaksch, MD. Schedule an appointment as soon as possible for a visit in 3 weeks.   Specialty:  Gastroenterology   Contact information:   [address], [address] [place] [address] [phone]       Signed: Letha Cape 04/29/2013, 9:58 AM

## 2013-04-29 NOTE — Progress Notes (Signed)
Discharge instructions reviewed with pt.  Pt verbalized understanding of discharge instructions.  No concerns verbalized about discharge.  Pt understands to follow up with Dr. Dulce Sellar in 3 weeks as directed on discharge instructions.

## 2013-05-03 NOTE — Discharge Summary (Signed)
She was discharged before I could evaluate her for a second time.

## 2013-05-04 ENCOUNTER — Telehealth: Payer: Self-pay

## 2013-05-04 DIAGNOSIS — Z0271 Encounter for disability determination: Secondary | ICD-10-CM

## 2013-05-04 NOTE — Telephone Encounter (Signed)
Disability ppwk in Dr. Ellis Parents box to be completed.

## 2013-05-10 NOTE — Telephone Encounter (Signed)
Angel Bennett has the forms. Dr Cleta Alberts has advised.

## 2013-05-11 NOTE — Telephone Encounter (Signed)
Paperwork completed and faxed to [employer].

## 2013-05-25 ENCOUNTER — Other Ambulatory Visit: Payer: Self-pay | Admitting: Emergency Medicine

## 2013-06-29 ENCOUNTER — Encounter: Payer: Self-pay | Admitting: Emergency Medicine

## 2013-11-03 ENCOUNTER — Other Ambulatory Visit (HOSPITAL_COMMUNITY)
Admission: RE | Admit: 2013-11-03 | Discharge: 2013-11-03 | Disposition: A | Discharge status: Home / Self Care | Payer: BC Managed Care – PPO | Source: Ambulatory Visit | Attending: Family Medicine | Admitting: Family Medicine

## 2013-11-03 ENCOUNTER — Other Ambulatory Visit: Payer: Self-pay | Admitting: Family Medicine

## 2013-11-03 DIAGNOSIS — Z124 Encounter for screening for malignant neoplasm of cervix: Secondary | ICD-10-CM | POA: Insufficient documentation

## 2013-11-17 ENCOUNTER — Other Ambulatory Visit: Payer: Self-pay

## 2013-11-17 DIAGNOSIS — Z1231 Encounter for screening mammogram for malignant neoplasm of breast: Secondary | ICD-10-CM

## 2013-11-29 ENCOUNTER — Encounter (INDEPENDENT_AMBULATORY_CARE_PROVIDER_SITE_OTHER): Payer: Self-pay

## 2013-11-29 ENCOUNTER — Ambulatory Visit
Admission: RE | Admit: 2013-11-29 | Discharge: 2013-11-29 | Disposition: A | Discharge status: Home / Self Care | Payer: BC Managed Care – PPO | Source: Ambulatory Visit

## 2013-11-29 DIAGNOSIS — Z1231 Encounter for screening mammogram for malignant neoplasm of breast: Secondary | ICD-10-CM

## 2013-12-01 ENCOUNTER — Other Ambulatory Visit: Payer: Self-pay | Admitting: Family Medicine

## 2013-12-01 DIAGNOSIS — R928 Other abnormal and inconclusive findings on diagnostic imaging of breast: Secondary | ICD-10-CM

## 2013-12-13 ENCOUNTER — Ambulatory Visit
Admission: RE | Admit: 2013-12-13 | Discharge: 2013-12-13 | Disposition: A | Discharge status: Home / Self Care | Payer: BC Managed Care – PPO | Source: Ambulatory Visit | Attending: Family Medicine | Admitting: Family Medicine

## 2013-12-13 DIAGNOSIS — R928 Other abnormal and inconclusive findings on diagnostic imaging of breast: Secondary | ICD-10-CM

## 2013-12-28 ENCOUNTER — Other Ambulatory Visit: Payer: Self-pay | Admitting: Family Medicine

## 2013-12-28 DIAGNOSIS — R1084 Generalized abdominal pain: Secondary | ICD-10-CM

## 2014-01-03 ENCOUNTER — Ambulatory Visit
Admission: RE | Admit: 2014-01-03 | Discharge: 2014-01-03 | Disposition: A | Discharge status: Home / Self Care | Payer: BC Managed Care – PPO | Source: Ambulatory Visit | Attending: Family Medicine | Admitting: Family Medicine

## 2014-01-03 DIAGNOSIS — R1084 Generalized abdominal pain: Secondary | ICD-10-CM

## 2014-01-03 MED ORDER — IOHEXOL 300 MG/ML  SOLN: 100.0000 mL | Freq: Once | INTRAMUSCULAR | Status: AC | PRN

## 2014-05-17 ENCOUNTER — Other Ambulatory Visit: Payer: Self-pay | Admitting: Family Medicine

## 2014-05-17 DIAGNOSIS — R102 Pelvic and perineal pain: Secondary | ICD-10-CM

## 2014-05-17 DIAGNOSIS — N83202 Unspecified ovarian cyst, left side: Secondary | ICD-10-CM

## 2014-05-18 ENCOUNTER — Other Ambulatory Visit: Payer: Self-pay | Admitting: Family Medicine

## 2014-05-18 DIAGNOSIS — R102 Pelvic and perineal pain: Secondary | ICD-10-CM

## 2014-05-18 DIAGNOSIS — N83202 Unspecified ovarian cyst, left side: Secondary | ICD-10-CM

## 2014-05-19 ENCOUNTER — Other Ambulatory Visit: Payer: BC Managed Care – PPO

## 2014-05-20 ENCOUNTER — Ambulatory Visit
Admission: RE | Admit: 2014-05-20 | Discharge: 2014-05-20 | Disposition: A | Discharge status: Home / Self Care | Payer: BC Managed Care – PPO | Source: Ambulatory Visit | Attending: Family Medicine | Admitting: Family Medicine

## 2014-05-20 DIAGNOSIS — R102 Pelvic and perineal pain: Secondary | ICD-10-CM

## 2014-05-20 DIAGNOSIS — N83202 Unspecified ovarian cyst, left side: Secondary | ICD-10-CM

## 2014-06-01 ENCOUNTER — Other Ambulatory Visit: Payer: Self-pay | Admitting: Family Medicine

## 2014-06-01 DIAGNOSIS — N6320 Unspecified lump in the left breast, unspecified quadrant: Secondary | ICD-10-CM

## 2014-06-15 ENCOUNTER — Encounter (INDEPENDENT_AMBULATORY_CARE_PROVIDER_SITE_OTHER): Payer: Self-pay

## 2014-06-15 ENCOUNTER — Ambulatory Visit
Admission: RE | Admit: 2014-06-15 | Discharge: 2014-06-15 | Disposition: A | Discharge status: Home / Self Care | Payer: BC Managed Care – PPO | Source: Ambulatory Visit | Attending: Family Medicine | Admitting: Family Medicine

## 2014-06-15 DIAGNOSIS — N6320 Unspecified lump in the left breast, unspecified quadrant: Secondary | ICD-10-CM

## 2014-07-09 ENCOUNTER — Ambulatory Visit (INDEPENDENT_AMBULATORY_CARE_PROVIDER_SITE_OTHER): Payer: BLUE CROSS/BLUE SHIELD

## 2014-07-09 ENCOUNTER — Ambulatory Visit (INDEPENDENT_AMBULATORY_CARE_PROVIDER_SITE_OTHER): Payer: BLUE CROSS/BLUE SHIELD | Admitting: Family Medicine

## 2014-07-09 VITALS — BP 108/62 | HR 112 | Temp 100.3°F | Resp 21 | Ht 63.5 in | Wt 183.0 lb

## 2014-07-09 DIAGNOSIS — R079 Chest pain, unspecified: Secondary | ICD-10-CM

## 2014-07-09 DIAGNOSIS — J189 Pneumonia, unspecified organism: Secondary | ICD-10-CM

## 2014-07-09 DIAGNOSIS — R059 Cough, unspecified: Secondary | ICD-10-CM

## 2014-07-09 DIAGNOSIS — R05 Cough: Secondary | ICD-10-CM

## 2014-07-09 DIAGNOSIS — R509 Fever, unspecified: Secondary | ICD-10-CM

## 2014-07-09 LAB — POCT CBC
Granulocyte percent: 81 %G — AB (ref 37–80)
HEMATOCRIT: 39.9 % (ref 37.7–47.9)
HEMOGLOBIN: 13.3 g/dL (ref 12.2–16.2)
Lymph, poc: 0.9 (ref 0.6–3.4)
MCH, POC: 29.2 pg (ref 27–31.2)
MCHC: 33.4 g/dL (ref 31.8–35.4)
MCV: 87.5 fL (ref 80–97)
MID (cbc): 0.2 (ref 0–0.9)
MPV: 7.5 fL (ref 0–99.8)
POC Granulocyte: 4.6 (ref 2–6.9)
POC LYMPH %: 15.3 % (ref 10–50)
POC MID %: 3.7 % (ref 0–12)
Platelet Count, POC: 204 10*3/uL (ref 142–424)
RBC: 4.56 M/uL (ref 4.04–5.48)
RDW, POC: 13 %
WBC: 5.7 10*3/uL (ref 4.6–10.2)

## 2014-07-09 MED ORDER — CEFTRIAXONE SODIUM 1 G IJ SOLR
1.0000 g | Freq: Once | INTRAMUSCULAR | Status: AC
  Administered 2014-07-09: 1 g via INTRAMUSCULAR

## 2014-07-09 MED ORDER — LEVOFLOXACIN 500 MG PO TABS: 500.0000 mg | ORAL_TABLET | Freq: Every day | ORAL | Status: AC

## 2014-07-09 NOTE — Progress Notes (Signed)
MRN: [id number] DOB: 1965-11-22  Subjective:   Angel Bennett is a 49 y.o. female presenting for 2 day history of cough productive with yellow sputum worse at night and intermittent chest pain x1 day. Chest pain started while she was lying down after she came home from running errands. Pain was sharp last night, radiated to back, currently achy in nature, bilateral, associated with heart racing, palpitations. Denies radiation of chest pain into neck, jaw or arm, diaphoresis. Cough also associated with wheezing, shob, sore throat, diaphoresis x1 (2 days ago), sinus headache, sinus congestion, itchy eyes, ear fullness, nausea, epigastric pain. Denies history of seasonal allergies, asthma. Denies smoking, denies alcohol use. Denies any other aggravating or relieving factors, no other questions or concerns.  Angel Bennett has a current medication list which includes the following prescription(s): lansoprazole and polyethylene glycol.  She has No Known Allergies.  Angel Bennett  has a past medical history of Abdominal pain; C. 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 Also  has past surgical history that includes Hemorrhoid surgery (1990); Cesarean section (1990); Cholecystectomy (03/13/2011); Esophagogastroduodenoscopy (05/15/2011); and Colonoscopy (05/15/2011).  ROS As in subjective.  Objective:   Vitals: BP 108/62 mmHg  Pulse 112  Temp(Src) 100.3 F (37.9 C) (Oral)  Resp 21  Ht 5' 3.5" (1.613 m)  Wt 183 lb (83.008 kg)  BMI 31.90 kg/m2  SpO2 97%  Physical Exam  Constitutional: She is oriented to person, place, and time and well-developed, well-nourished, and in no distress.  HENT:  TM's pearly and intact bilaterally, no effusions or erythema. Nasal turbinates slightly inflamed and dry, no rhinorrhea. Mucous membranes moist, oropharynx clear.   Eyes: Conjunctivae and EOM are normal. Right eye exhibits no discharge. Left eye exhibits  no discharge.  Neck: Normal range of motion.  Cardiovascular: Regular rhythm, normal heart sounds and intact distal pulses.  Tachycardia present.  Exam reveals no gallop and no friction rub.   No murmur heard. Pulmonary/Chest: Effort normal. No stridor. No respiratory distress. She has no wheezes. Rales: bibasilar coarse lung sounds. She exhibits no tenderness.  Abdominal: Soft. Bowel sounds are normal. She exhibits no distension and no mass. There is tenderness (epigastric with deep palpation). There is no guarding.  Musculoskeletal: Normal range of motion. She exhibits no edema or tenderness.  Lymphadenopathy:    She has no cervical adenopathy.  Neurological: She is alert and oriented to person, place, and time.  Skin: Skin is warm and dry. No rash noted. She is not diaphoretic. No erythema.  Psychiatric: Mood and affect normal.   Results for orders placed or performed in visit on 07/09/14 (from the past 24 hour(s))  POCT CBC     Status: Abnormal   Collection Time: 07/09/14  9:47 AM  Result Value Ref Range   WBC 5.7 4.6 - 10.2 K/uL   Lymph, poc 0.9 0.6 - 3.4   POC LYMPH PERCENT 15.3 10 - 50 %L   MID (cbc) 0.2 0 - 0.9   POC MID % 3.7 0 - 12 %M   POC Granulocyte 4.6 2 - 6.9   Granulocyte percent 81.0 (A) 37 - 80 %G   RBC 4.56 4.04 - 5.48 M/uL   Hemoglobin 13.3 12.2 - 16.2 g/dL   HCT, POC 39.9 37.7 - 47.9 %   MCV 87.5 80 - 97 fL   MCH, POC 29.2 27 - 31.2 pg   MCHC 33.4 31.8 - 35.4 g/dL   RDW, POC 13.0 %  Platelet Count, POC 204 142 - 424 K/uL   MPV 7.5 0 - 99.8 fL   [facility] reading (PRIMARY) by  Dr. Linna Darner and PA-Angel Bennett. CXR: Right lower lobe infiltrate, possible left infiltrate in left oblique fissure consistent with early pneumonia.   Dg Chest 2 View  07/09/2014   CLINICAL DATA:  Acute onset of productive cough, wheezing, shortness of breath, diaphoresis and intermittent chest pain. Initial encounter.  EXAM: CHEST  2 VIEW  COMPARISON:  Chest radiograph performed 04/28/2013   FINDINGS: The lungs are well-aerated. Bibasilar and left mid lung airspace opacities raise concern for pneumonia. There is no evidence of pleural effusion or pneumothorax.  The heart is normal in size; the mediastinal contour is within normal limits. No acute osseous abnormalities are seen.  IMPRESSION: Multifocal pneumonia noted, more prominent on the left.   Electronically Signed   By: Garald Balding M.D.   On: 07/09/2014 10:49   Assessment and Plan :   1. Chest pain, unspecified chest pain type 2. Cough 3. Fever, unspecified fever cause 4. Pneumonia, organism unspecified - For pneumonia start levaquin x 7 days, IM Rocephin during visit without incident. - Advised ibuprofen for fever, sore throat, sinus headache - Advised Zantac for epigastric pain likely due to history of reflux - Return to clinic if symptoms worsen, fail to resolve or as needed  Angel Eagles, PA-C [facility] [phone] 07/09/2014 9:53 AM

## 2014-07-09 NOTE — Patient Instructions (Signed)

## 2014-07-09 NOTE — Progress Notes (Signed)
Discussed patient with [facility].  Chest x-ray examine. Patient does have a right lower lobe pneumonia. Treatment plan discussed and agreed upon.

## 2014-07-11 ENCOUNTER — Other Ambulatory Visit: Payer: Self-pay | Admitting: Family Medicine

## 2014-07-11 ENCOUNTER — Telehealth: Payer: Self-pay

## 2014-07-11 DIAGNOSIS — R05 Cough: Secondary | ICD-10-CM

## 2014-07-11 DIAGNOSIS — R059 Cough, unspecified: Secondary | ICD-10-CM

## 2014-07-11 MED ORDER — BENZONATATE 100 MG PO CAPS: 100.0000 mg | ORAL_CAPSULE | Freq: Three times a day (TID) | ORAL | Status: DC | PRN

## 2014-07-11 MED ORDER — HYDROCODONE-HOMATROPINE 5-1.5 MG/5ML PO SYRP
ORAL_SOLUTION | ORAL | Status: DC
Start: 2014-07-11 — End: 2014-07-15

## 2014-07-11 NOTE — Telephone Encounter (Signed)
Tessalon prescribed to pharmacy  Cough syrup prescription written  Please let patient know if she is doing worse she needs to be seen.

## 2014-07-11 NOTE — Telephone Encounter (Signed)
Patient was recently in for an illness and was giving an antibiotic. She has been coughing a lot since she was here and is requesting a cough medicine. Please send to [facility] on [address]. Patients call back number is [phone]

## 2014-07-11 NOTE — Telephone Encounter (Signed)
Pt advised.  Rx in pick up drawer.

## 2014-07-12 ENCOUNTER — Telehealth: Payer: Self-pay

## 2014-07-12 NOTE — Telephone Encounter (Addendum)
Pt states she was diagnosed with pneumonia and hasn't been able to get out of bed, would like an oow note for Monday(yesterday)  thru Friday and rtw on Monday Please call [phone] and when we call her back, she will give [place] a fax number

## 2014-07-12 NOTE — Telephone Encounter (Signed)
Please advise 

## 2014-07-14 NOTE — Telephone Encounter (Signed)
Call: Needs rechecked.

## 2014-07-15 ENCOUNTER — Ambulatory Visit (INDEPENDENT_AMBULATORY_CARE_PROVIDER_SITE_OTHER): Payer: BLUE CROSS/BLUE SHIELD | Admitting: Family Medicine

## 2014-07-15 ENCOUNTER — Ambulatory Visit (INDEPENDENT_AMBULATORY_CARE_PROVIDER_SITE_OTHER): Payer: BLUE CROSS/BLUE SHIELD

## 2014-07-15 VITALS — BP 106/68 | HR 66 | Temp 98.3°F | Resp 16 | Ht 63.0 in | Wt 180.0 lb

## 2014-07-15 DIAGNOSIS — R0789 Other chest pain: Secondary | ICD-10-CM

## 2014-07-15 DIAGNOSIS — R059 Cough, unspecified: Secondary | ICD-10-CM

## 2014-07-15 DIAGNOSIS — J189 Pneumonia, unspecified organism: Secondary | ICD-10-CM

## 2014-07-15 DIAGNOSIS — R05 Cough: Secondary | ICD-10-CM

## 2014-07-15 LAB — POCT CBC
GRANULOCYTE PERCENT: 50.6 % (ref 37–80)
HCT, POC: 39.8 % (ref 37.7–47.9)
HEMOGLOBIN: 13.2 g/dL (ref 12.2–16.2)
Lymph, poc: 2.5 (ref 0.6–3.4)
MCH, POC: 29.1 pg (ref 27–31.2)
MCHC: 33 g/dL (ref 31.8–35.4)
MCV: 88.2 fL (ref 80–97)
MID (CBC): 0.4 (ref 0–0.9)
MPV: 7.7 fL (ref 0–99.8)
POC Granulocyte: 2.9 (ref 2–6.9)
POC LYMPH PERCENT: 42.3 %L (ref 10–50)
POC MID %: 7.1 % (ref 0–12)
Platelet Count, POC: 300 10*3/uL (ref 142–424)
RBC: 4.52 M/uL (ref 4.04–5.48)
RDW, POC: 12.8 %
WBC: 5.8 10*3/uL (ref 4.6–10.2)

## 2014-07-15 MED ORDER — HYDROCODONE-HOMATROPINE 5-1.5 MG/5ML PO SYRP: ORAL_SOLUTION | ORAL | Status: DC

## 2014-07-15 MED ORDER — DICLOFENAC SODIUM 75 MG PO TBEC: 75.0000 mg | DELAYED_RELEASE_TABLET | Freq: Two times a day (BID) | ORAL | Status: DC

## 2014-07-15 NOTE — Patient Instructions (Signed)
Continue using the cough syrup as necessary  Drink plenty of water  Take the diclofenac one pill twice daily at breakfast and supper for inflammation and pain of the chest wall  If you continue to have worse pain at anytime feel free to return. If you are not doing any better after 2 weeks and still would like a referral elsewhere we can send you to a heart doctor. I do not feel this is necessary at this time.

## 2014-07-15 NOTE — Telephone Encounter (Signed)
Spoke to pt- she will be coming in today for a recheck with Dr. Linna Darner.

## 2014-07-15 NOTE — Progress Notes (Signed)
Subjective: 49 year old lady who was here last week with a multifocal pneumonia on her chest x-ray. She was treated with injection and oral antibiotics. She has improved some on her cough. She still coughs up mucus however. She is not as bad in the breathing at nighttime and she was. She does continue to have a substernal chest pain. His heart when she coughs. She says that it was hurting there even when she was not coughing prior to getting the pneumonia. She asked whether she could see a specialist. She does not smoke. She is accompanied today by her 43 year old son. She's not been running a fever now.  Objective: Continues to have some cough. TMs are normal. Throat clear. Neck supple without significant nodes. Chest seems a little clearer than last week, heart regular without murmurs. Chest wall is tender. She question about the prominence of the manubrium.  Assessment: Pneumonia, partially resolved Chest wall pain  Plan: Repeat CBC and chest x-ray  [facility] reading (PRIMARY) by  Dr. Linna Darner Clearing of previous pneumonia  Results for orders placed or performed in visit on 07/15/14  POCT CBC  Result Value Ref Range   WBC 5.8 4.6 - 10.2 K/uL   Lymph, poc 2.5 0.6 - 3.4   POC LYMPH PERCENT 42.3 10 - 50 %L   MID (cbc) 0.4 0 - 0.9   POC MID % 7.1 0 - 12 %M   POC Granulocyte 2.9 2 - 6.9   Granulocyte percent 50.6 37 - 80 %G   RBC 4.52 4.04 - 5.48 M/uL   Hemoglobin 13.2 12.2 - 16.2 g/dL   HCT, POC 39.8 37.7 - 47.9 %   MCV 88.2 80 - 97 fL   MCH, POC 29.1 27 - 31.2 pg   MCHC 33.0 31.8 - 35.4 g/dL   RDW, POC 12.8 %   Platelet Count, POC 300 142 - 424 K/uL   MPV 7.7 0 - 99.8 fL   .

## 2014-07-19 ENCOUNTER — Encounter: Payer: Self-pay | Admitting: Family Medicine

## 2014-07-19 ENCOUNTER — Telehealth: Payer: Self-pay | Admitting: Family Medicine

## 2014-07-19 NOTE — Telephone Encounter (Signed)
[facility] faxed an Attending Physician Statement on 07/15/2014 to be completed by Dr. Linna Darner (whom patient has seen during her last two visits at [facility]). I have scanned a blank copy of this form in patient's chart and placed the original in Dr. Clayborn Heron box on 07/19/2014. They have also requested records be sent as well. I have send Bebe Liter an Pharmacologist for records and the completion of the Physician Statement. Please return completed forms to FMLA/Disability within 5-7 business days.   Thanks, [employer]

## 2014-07-21 NOTE — Telephone Encounter (Signed)
Pt came on and filled out request form, states these need to be sent in to [employer] by the 1st

## 2014-07-25 ENCOUNTER — Telehealth: Payer: Self-pay

## 2014-07-25 NOTE — Telephone Encounter (Signed)
The patient called to inquire about the status of her FMLA paperwork that she dropped off several days ago.  She said that she is concerned that it will not be sent in time and that it will be denied.  CB#: [phone]

## 2014-07-25 NOTE — Telephone Encounter (Signed)
Please see previous phone messages from the 19th and the 21st. What is the status of patient's FMLA paperwork? We are only at the 4th business day for completion so I am not too worried about it at the moment.

## 2014-07-25 NOTE — Telephone Encounter (Signed)
error 

## 2014-07-26 DIAGNOSIS — Z0271 Encounter for disability determination: Secondary | ICD-10-CM

## 2014-07-26 NOTE — Telephone Encounter (Signed)
Ok. Thanks!

## 2014-07-26 NOTE — Telephone Encounter (Signed)
Forms returned to [employer] with my part done.    If patient still having problems she needs seen.

## 2014-09-05 ENCOUNTER — Encounter: Payer: Self-pay | Admitting: Internal Medicine

## 2014-09-13 ENCOUNTER — Ambulatory Visit (INDEPENDENT_AMBULATORY_CARE_PROVIDER_SITE_OTHER): Payer: BLUE CROSS/BLUE SHIELD

## 2014-09-13 ENCOUNTER — Ambulatory Visit (INDEPENDENT_AMBULATORY_CARE_PROVIDER_SITE_OTHER): Payer: BLUE CROSS/BLUE SHIELD | Admitting: Emergency Medicine

## 2014-09-13 VITALS — BP 137/84 | HR 63 | Temp 97.8°F | Resp 16 | Ht 63.25 in | Wt 179.2 lb

## 2014-09-13 DIAGNOSIS — K2100 Gastro-esophageal reflux disease with esophagitis, without bleeding: Secondary | ICD-10-CM

## 2014-09-13 DIAGNOSIS — R1013 Epigastric pain: Secondary | ICD-10-CM

## 2014-09-13 LAB — POCT CBC
Granulocyte percent: 45.4 %G (ref 37–80)
HCT, POC: 41.6 % (ref 37.7–47.9)
HEMOGLOBIN: 13 g/dL (ref 12.2–16.2)
Lymph, poc: 3.4 (ref 0.6–3.4)
MCH: 28.4 pg (ref 27–31.2)
MCHC: 31.3 g/dL — AB (ref 31.8–35.4)
MCV: 90.7 fL (ref 80–97)
MID (cbc): 0.2 (ref 0–0.9)
MPV: 8.4 fL (ref 0–99.8)
POC Granulocyte: 3 (ref 2–6.9)
POC LYMPH PERCENT: 51.8 %L — AB (ref 10–50)
POC MID %: 2.8 %M (ref 0–12)
Platelet Count, POC: 253 10*3/uL (ref 142–424)
RBC: 4.58 M/uL (ref 4.04–5.48)
RDW, POC: 14.5 %
WBC: 6.5 10*3/uL (ref 4.6–10.2)

## 2014-09-13 MED ORDER — SUCRALFATE 1 G PO TABS: ORAL_TABLET | ORAL | Status: DC

## 2014-09-13 MED ORDER — LANSOPRAZOLE 30 MG PO CPDR: 30.0000 mg | DELAYED_RELEASE_CAPSULE | Freq: Every day | ORAL | Status: DC

## 2014-09-13 NOTE — Patient Instructions (Signed)
Gastroesophageal Reflux Disease, Adult Gastroesophageal reflux disease (GERD) happens when acid from your stomach flows up into the esophagus. When acid comes in contact with the esophagus, the acid causes soreness (inflammation) in the esophagus. Over time, GERD may create small holes (ulcers) in the lining of the esophagus. CAUSES   Increased body weight. This puts pressure on the stomach, making acid rise from the stomach into the esophagus.  Smoking. This increases acid production in the stomach.  Drinking alcohol. This causes decreased pressure in the lower esophageal sphincter (valve or ring of muscle between the esophagus and stomach), allowing acid from the stomach into the esophagus.  Late evening meals and a full stomach. This increases pressure and acid production in the stomach.  A malformed lower esophageal sphincter. Sometimes, no cause is found. SYMPTOMS   Burning pain in the lower part of the mid-chest behind the breastbone and in the mid-stomach area. This may occur twice a week or more often.  Trouble swallowing.  Sore throat.  Dry cough.  Asthma-like symptoms including chest tightness, shortness of breath, or wheezing. DIAGNOSIS  Your caregiver may be able to diagnose GERD based on your symptoms. In some cases, X-rays and other tests may be done to check for complications or to check the condition of your stomach and esophagus. TREATMENT  Your caregiver may recommend over-the-counter or prescription medicines to help decrease acid production. Ask your caregiver before starting or adding any new medicines.  HOME CARE INSTRUCTIONS   Change the factors that you can control. Ask your caregiver for guidance concerning weight loss, quitting smoking, and alcohol consumption.  Avoid foods and drinks that make your symptoms worse, such as:  Caffeine or alcoholic drinks.  Chocolate.  Peppermint or mint flavorings.  Garlic and onions.  Spicy foods.  Citrus fruits,  such as oranges, lemons, or limes.  Tomato-based foods such as sauce, chili, salsa, and pizza.  Fried and fatty foods.  Avoid lying down for the 3 hours prior to your bedtime or prior to taking a nap.  Eat small, frequent meals instead of large meals.  Wear loose-fitting clothing. Do not wear anything tight around your waist that causes pressure on your stomach.  Raise the head of your bed 6 to 8 inches with wood blocks to help you sleep. Extra pillows will not help.  Only take over-the-counter or prescription medicines for pain, discomfort, or fever as directed by your caregiver.  Do not take aspirin, ibuprofen, or other nonsteroidal anti-inflammatory drugs (NSAIDs). SEEK IMMEDIATE MEDICAL CARE IF:   You have pain in your arms, neck, jaw, teeth, or back.  Your pain increases or changes in intensity or duration.  You develop nausea, vomiting, or sweating (diaphoresis).  You develop shortness of breath, or you faint.  Your vomit is green, yellow, black, or looks like coffee grounds or blood.  Your stool is red, bloody, or black. These symptoms could be signs of other problems, such as heart disease, gastric bleeding, or esophageal bleeding. MAKE SURE YOU:   Understand these instructions.  Will watch your condition.  Will get help right away if you are not doing well or get worse. Document Released: 03/27/2005 Document Revised: 09/09/2011 Document Reviewed: 01/04/2011 ExitCare Patient Information 2015 [employer]. This information is not intended to replace advice given to you by your health care provider. Make sure you discuss any questions you have with your health care provider.  

## 2014-09-13 NOTE — Progress Notes (Signed)
[facility] [address], [place] [address] [phone]  Date:  09/13/2014   Name:  Angel Bennett   DOB:  December 31, 1965   MRN:  [id number]  PCP:  No Pcp Per Pt    Chief Complaint: Abdominal Pain; Nausea; and Constipation   History of Present Illness:  Angel Bennett is a 49 y.o. very pleasant female patient who presents with the following:  Ill with abdominal discomfort and belching Says has not been "well" since cholecystectomy Has watebrash and burning in chest and pain with eating any food. No nausea or vomiting Seems to be constipated.  The patient has no complaint of blood, mucous, or pus in her stools. Poor appetite due bloating. Says 7 pound weight loss in two weeks due pain. No alcohol or smoking.  Some caffeine.  No excess NSAID or ASA No improvement with over the counter medications or other home remedies.  Denies other complaint or health concern today.   Patient Active Problem List   Diagnosis Date Noted  . Chronic abdominal pain 04/28/2013  . Partial small bowel obstruction 04/28/2013  . History of Helicobacter pylori infection 04/28/2013  . Chronic cholecystitis 03/06/2011  . GERD (gastroesophageal reflux disease) 03/06/2011  . Constipation, chronic 03/06/2011    Past Medical History  Diagnosis Date  . Abdominal pain     Diffuse mild  . C. difficile colitis [id number]    Treated w PO Metronidazole  . Constipation, chronic   . GERD (gastroesophageal reflux disease)   . 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 Hemorrhoid surgery  1990  . Cesarean section  1990  . Cholecystectomy  03/13/2011    lap single site  . Esophagogastroduodenoscopy  05/15/2011    Procedure: ESOPHAGOGASTRODUODENOSCOPY (EGD);  Surgeon: Landry Dyke, MD;  Location: [facility];  Service: Endoscopy;  Laterality: N/A;  . Colonoscopy  05/15/2011    Procedure: COLONOSCOPY;  Surgeon: Landry Dyke, MD;  Location:  [facility];  Service: Endoscopy;  Laterality: N/A;    History  Substance Use Topics  . Smoking status: Never Smoker   . Smokeless tobacco: Never Used  . Alcohol Use: No    No family history on file.  No Known Allergies  Medication list has been reviewed and updated.  No current outpatient prescriptions on file prior to visit.   No current facility-administered medications on file prior to visit.    Review of Systems:  As per HPI, otherwise negative.    Physical Examination: Filed Vitals:   09/13/14 2024  BP: 137/84  Pulse: 63  Temp: 97.8 F (36.6 C)  Resp: 16   Filed Vitals:   09/13/14 2024  Height: 5' 3.25" (1.607 m)  Weight: 179 lb 3.2 oz (81.285 kg)   Body mass index is 31.48 kg/(m^2). Ideal Body Weight: Weight in (lb) to have BMI = 25: 142  GEN: WDWN, NAD, Non-toxic, A & O x 3 HEENT: Atraumatic, Normocephalic. Neck supple. No masses, No LAD. Ears and Nose: No external deformity. CV: RRR, No M/G/R. No JVD. No thrill. No extra heart sounds. PULM: CTA B, no wheezes, crackles, rhonchi. No retractions. No resp. distress. No accessory muscle use. ABD: S, NT, ND, +BS. No rebound. No HSM. EXTR: No c/c/e NEURO Normal gait.  PSYCH: Normally interactive. Conversant. Not depressed or anxious appearing.  Calm demeanor.    Assessment and Plan: Gastritis GERD  Signed,  Jacqulynn Cadet  Ouida Sills, MD   [facility] reading (PRIMARY) by  Dr. Ouida Sills.  Negative.  suboptimal abdomen.  Results for orders placed or performed in visit on 09/13/14  POCT CBC  Result Value Ref Range   WBC 6.5 4.6 - 10.2 K/uL   Lymph, poc 3.4 0.6 - 3.4   POC LYMPH PERCENT 51.8 (A) 10 - 50 %L   MID (cbc) 0.2 0 - 0.9   POC MID % 2.8 0 - 12 %M   POC Granulocyte 3.0 2 - 6.9   Granulocyte percent 45.4 37 - 80 %G   RBC 4.58 4.04 - 5.48 M/uL   Hemoglobin 13.0 12.2 - 16.2 g/dL   HCT, POC 41.6 37.7 - 47.9 %   MCV 90.7 80 - 97 fL   MCH, POC 28.4 27 - 31.2 pg   MCHC 31.3 (A) 31.8 - 35.4 g/dL   RDW,  POC 14.5 %   Platelet Count, POC 253 142 - 424 K/uL   MPV 8.4 0 - 99.8 fL

## 2014-09-14 LAB — COMPREHENSIVE METABOLIC PANEL
ALBUMIN: 4.1 g/dL (ref 3.5–5.2)
ALK PHOS: 98 U/L (ref 39–117)
ALT: 10 U/L (ref 0–35)
AST: 18 U/L (ref 0–37)
BUN: 7 mg/dL (ref 6–23)
CALCIUM: 9.4 mg/dL (ref 8.4–10.5)
CHLORIDE: 103 meq/L (ref 96–112)
CO2: 25 mEq/L (ref 19–32)
CREATININE: 0.64 mg/dL (ref 0.50–1.10)
GLUCOSE: 89 mg/dL (ref 70–99)
POTASSIUM: 3.7 meq/L (ref 3.5–5.3)
Sodium: 138 mEq/L (ref 135–145)
TOTAL PROTEIN: 7.9 g/dL (ref 6.0–8.3)
Total Bilirubin: 0.4 mg/dL (ref 0.2–1.2)

## 2014-09-14 LAB — AMYLASE: Amylase: 55 U/L (ref 0–105)

## 2014-09-14 LAB — LIPASE: LIPASE: 16 U/L (ref 0–75)

## 2014-10-10 ENCOUNTER — Ambulatory Visit (INDEPENDENT_AMBULATORY_CARE_PROVIDER_SITE_OTHER): Payer: BLUE CROSS/BLUE SHIELD | Admitting: Internal Medicine

## 2014-10-10 ENCOUNTER — Ambulatory Visit (INDEPENDENT_AMBULATORY_CARE_PROVIDER_SITE_OTHER): Payer: BLUE CROSS/BLUE SHIELD

## 2014-10-10 ENCOUNTER — Other Ambulatory Visit: Payer: Self-pay | Admitting: Internal Medicine

## 2014-10-10 VITALS — BP 118/76 | HR 67 | Temp 97.8°F | Resp 15 | Ht 63.25 in | Wt 182.0 lb

## 2014-10-10 DIAGNOSIS — M25561 Pain in right knee: Secondary | ICD-10-CM | POA: Diagnosis not present

## 2014-10-10 DIAGNOSIS — M25532 Pain in left wrist: Secondary | ICD-10-CM | POA: Diagnosis not present

## 2014-10-10 DIAGNOSIS — R208 Other disturbances of skin sensation: Secondary | ICD-10-CM | POA: Diagnosis not present

## 2014-10-10 DIAGNOSIS — M25579 Pain in unspecified ankle and joints of unspecified foot: Secondary | ICD-10-CM

## 2014-10-10 DIAGNOSIS — M25562 Pain in left knee: Secondary | ICD-10-CM

## 2014-10-10 DIAGNOSIS — M25531 Pain in right wrist: Secondary | ICD-10-CM

## 2014-10-10 DIAGNOSIS — R209 Unspecified disturbances of skin sensation: Secondary | ICD-10-CM

## 2014-10-10 LAB — POCT CBC
Granulocyte percent: 50.1 %G (ref 37–80)
HEMATOCRIT: 38.5 % (ref 37.7–47.9)
HEMOGLOBIN: 12.7 g/dL (ref 12.2–16.2)
Lymph, poc: 2.1 (ref 0.6–3.4)
MCH, POC: 28.1 pg (ref 27–31.2)
MCHC: 33 g/dL (ref 31.8–35.4)
MCV: 85.2 fL (ref 80–97)
MID (cbc): 0.4 (ref 0–0.9)
MPV: 7.9 fL (ref 0–99.8)
POC GRANULOCYTE: 2.5 (ref 2–6.9)
POC LYMPH PERCENT: 41.6 %L (ref 10–50)
POC MID %: 8.3 %M (ref 0–12)
Platelet Count, POC: 286 10*3/uL (ref 142–424)
RBC: 4.52 M/uL (ref 4.04–5.48)
RDW, POC: 13 %
WBC: 5 10*3/uL (ref 4.6–10.2)

## 2014-10-10 LAB — POCT SEDIMENTATION RATE: POCT SED RATE: 59 mm/hr — AB (ref 0–22)

## 2014-10-10 MED ORDER — MELOXICAM 15 MG PO TABS: 15.0000 mg | ORAL_TABLET | Freq: Every day | ORAL | Status: DC

## 2014-10-10 NOTE — Progress Notes (Signed)
Subjective:    Patient ID: Angel Bennett, female    DOB: 05/27/1966, 49 y.o.   MRN: [id number]   This chart was scribed for Tami Lin, MD by Stephania Fragmin, ED Scribe. This patient was seen in room 13 and the patient's care was started at 11:51 AM.   HPI  HPI Comments: Angel Bennett is a 49 y.o. female who presents to the [facility] complaining of bilateral left lower extremity pain that has been intermittent for "a while" but which became constant over the past week. She feels pain mainly in the bilateral knees and in the calves, and endorses associated swelling. She said it was more swollen, dark, and red last week, but the swelling has decreased and the discoloration has since resolved. Walking exacerbates the pain. Patient is currently taking Prevacid and Carafate for GERD. Patient also notes intermittent swelling and pain in the wrist, and pain in her lower back. She endorses a family history of arthritis. She states the soles of her feet are constantly cold, which forces her to wear thick socks and heavy blankets. She denies any numbness in her feet.   Review of Systems  Musculoskeletal: Positive for joint swelling.  Skin: Positive for color change (resolved).  Neurological: Negative for numbness.       Objective:   Physical Exam  Constitutional: She is oriented to person, place, and time. She appears well-developed and well-nourished. No distress.  Eyes: Conjunctivae and EOM are normal. Pupils are equal, round, and reactive to light.  Neck: Neck supple.  Cardiovascular: Normal rate.   Pulmonary/Chest: Effort normal.  Musculoskeletal:  Both knees are slightly puffy. No redness. Range of motion is full, with pain on extension and flexion. The right knee has more tenderness to joint stressors, but no laxity. There is crepitus with right knee ROM. The left knee is tender to palpation along the medial joint line.  Both calves are tender and both Achilles' are  tender, without swelling or redness.  Full peripheral pulses, and no sensory losses in the feet. Feet are tender to palpation dorsally.  Hips have full ROM without pain. Straight leg raise to 90 degrees is negative.  Both wrists are tender to palpation without swelling, redness, or loss of ROM. No joint deformities of the hands.   Neurological: She is alert and oriented to person, place, and time. No cranial nerve deficit.  Skin: No rash noted.  No rashes.  Psychiatric: She has a normal mood and affect.  Nursing note and vitals reviewed.  Wt Readings from Last 3 Encounters:  10/10/14 182 lb (82.555 kg)  09/13/14 179 lb 3.2 oz (81.285 kg)  07/15/14 180 lb (81.647 kg)   Results for orders placed or performed in visit on 10/10/14  POCT CBC  Result Value Ref Range   WBC 5.0 4.6 - 10.2 K/uL   Lymph, poc 2.1 0.6 - 3.4   POC LYMPH PERCENT 41.6 10 - 50 %L   MID (cbc) 0.4 0 - 0.9   POC MID % 8.3 0 - 12 %M   POC Granulocyte 2.5 2 - 6.9   Granulocyte percent 50.1 37 - 80 %G   RBC 4.52 4.04 - 5.48 M/uL   Hemoglobin 12.7 12.2 - 16.2 g/dL   HCT, POC 38.5 37.7 - 47.9 %   MCV 85.2 80 - 97 fL   MCH, POC 28.1 27 - 31.2 pg   MCHC 33.0 31.8 - 35.4 g/dL   RDW, POC 13.0 %  Platelet Count, POC 286 142 - 424 K/uL   MPV 7.9 0 - 99.8 fL    Primary Right Knee X-Ray Reading by Dr. Laney Pastor at [facility]:  There are minimal degenerative changes in right knee; otherwise normal.    Assessment & Plan:   I have completed the patient encounter in its entirety as documented by the scribe, with editing by me where necessary. Osiris Charles P. Laney Pastor, M.D.  Pain in knee joint, left - Plan: POCT CBC, POCT SEDIMENTATION RATE, Rheumatoid factor, ANA, CANCELED: ANA w/Reflex  Knee pain, acute, right - Plan: POCT SEDIMENTATION RATE, Rheumatoid factor, DG Knee 1-2 Views Right, ANA, CANCELED: ANA w/Reflex  Pain in joint, ankle and foot, unspecified laterality - Plan: POCT SEDIMENTATION RATE, Rheumatoid factor, ANA,  CANCELED: ANA w/Reflex  Pain in both wrists - Plan: POCT SEDIMENTATION RATE, Rheumatoid factor, ANA, CANCELED: ANA w/Reflex  Bilateral cold feet - Plan: POCT CBC, TSH, ANA  Meds ordered this encounter  Medications  . meloxicam (MOBIC) 15 MG tablet    Sig: Take 1 tablet (15 mg total) by mouth daily.    Dispense:  30 tablet    Refill:  0   Addend labs 4/14 Results for orders placed or performed in visit on 10/10/14  Rheumatoid factor  Result Value Ref Range   Rhuematoid fact SerPl-aCnc <10 <=14 IU/mL  TSH  Result Value Ref Range   TSH 0.331 (L) 0.350 - 4.500 uIU/mL  ANA  Result Value Ref Range   Anit Nuclear Antibody(ANA) POS (A) NEGATIVE  Anti-nuclear ab-titer (ANA titer)  Result Value Ref Range   ANA Titer 1 1:80 (H) <1:40     ANA Pattern 1 SPECKLED (A)   POCT CBC  Result Value Ref Range   WBC 5.0 4.6 - 10.2 K/uL   Lymph, poc 2.1 0.6 - 3.4   POC LYMPH PERCENT 41.6 10 - 50 %L   MID (cbc) 0.4 0 - 0.9   POC MID % 8.3 0 - 12 %M   POC Granulocyte 2.5 2 - 6.9   Granulocyte percent 50.1 37 - 80 %G   RBC 4.52 4.04 - 5.48 M/uL   Hemoglobin 12.7 12.2 - 16.2 g/dL   HCT, POC 38.5 37.7 - 47.9 %   MCV 85.2 80 - 97 fL   MCH, POC 28.1 27 - 31.2 pg   MCHC 33.0 31.8 - 35.4 g/dL   RDW, POC 13.0 %   Platelet Count, POC 286 142 - 424 K/uL   MPV 7.9 0 - 99.8 fL  POCT SEDIMENTATION RATE  Result Value Ref Range   POCT SED RATE 59 (A) 0 - 22 mm/hr   Will contact with f/u///added free t4

## 2014-10-11 LAB — ANTI-NUCLEAR AB-TITER (ANA TITER): ANA Titer 1: 1:80 {titer} — ABNORMAL HIGH

## 2014-10-11 LAB — ANA: Anti Nuclear Antibody(ANA): POSITIVE — AB

## 2014-10-11 LAB — TSH: TSH: 0.331 u[IU]/mL — ABNORMAL LOW (ref 0.350–4.500)

## 2014-10-11 LAB — RHEUMATOID FACTOR: Rhuematoid fact SerPl-aCnc: <10 IU/mL (ref <=14)

## 2014-10-12 LAB — T4, FREE: Free T4: 1.1 ng/dL (ref 0.80–1.80)

## 2014-10-13 ENCOUNTER — Telehealth: Payer: Self-pay

## 2014-10-13 DIAGNOSIS — R899 Unspecified abnormal finding in specimens from other organs, systems and tissues: Secondary | ICD-10-CM

## 2014-10-13 NOTE — Telephone Encounter (Signed)
Spoke to pt, she is aware, and would like to be referred.  i have made the referral for rheumatology.

## 2014-10-26 ENCOUNTER — Ambulatory Visit (INDEPENDENT_AMBULATORY_CARE_PROVIDER_SITE_OTHER): Payer: BLUE CROSS/BLUE SHIELD | Admitting: Internal Medicine

## 2014-10-26 ENCOUNTER — Encounter: Payer: Self-pay | Admitting: Internal Medicine

## 2014-10-26 VITALS — BP 102/70 | HR 72 | Ht 63.25 in | Wt 180.0 lb

## 2014-10-26 DIAGNOSIS — K5900 Constipation, unspecified: Secondary | ICD-10-CM | POA: Diagnosis not present

## 2014-10-26 DIAGNOSIS — R140 Abdominal distension (gaseous): Secondary | ICD-10-CM

## 2014-10-26 DIAGNOSIS — R195 Other fecal abnormalities: Secondary | ICD-10-CM | POA: Diagnosis not present

## 2014-10-26 DIAGNOSIS — R6881 Early satiety: Secondary | ICD-10-CM

## 2014-10-26 DIAGNOSIS — R6889 Other general symptoms and signs: Secondary | ICD-10-CM | POA: Diagnosis not present

## 2014-10-26 MED ORDER — NA SULFATE-K SULFATE-MG SULF 17.5-3.13-1.6 GM/177ML PO SOLN: ORAL | Status: DC

## 2014-10-26 NOTE — Patient Instructions (Signed)
You have been scheduled for a colonoscopy. Please follow written instructions given to you at your visit today.  Please pick up your prep supplies at the pharmacy within the next 1-3 days. If you use inhalers (even only as needed), please bring them with you on the day of your procedure.   Today you have been given a work note for your employer.    I appreciate the opportunity to care for you. Silvano Rusk, M.D., [facility]

## 2014-10-26 NOTE — Progress Notes (Signed)
Referred by: Roselee Culver, MD  Subjective:    Patient ID: Angel Bennett, female    DOB: 1965/12/16, 49 y.o.   MRN: [id number] Cc: abdominal pain and constipation HPI The patient is a nice lady originally from the [place] who has been having problems with chronic constipation over the years. However things have changed where she is having upper abdominal bloating and early satiety. He is gaining weight. She moves her bowels about 2-3 times a week. She never feels like she has good or complete defecation. She is used some intermittent MiraLAX and says it will help but she takes multiple doses for multiple days but she does not do that on a regular basis as she is concerned about a regular medication. There is nausea as well but no vomiting. She feels like she has trapped gas that she cannot relief. Stools have been dark at times. She has this upper abdominal pain and when she lies on her left side her chest wall hurts sometimes.  She was admitted to the hospital in 2014 by the surgeons for similar symptoms. There is a history of treatment with H. pylori once if not twice. She had an upper endoscopy and a colonoscopy in 2012 by Dr. Paulita Fujita. These were unremarkable.  Reports and images reviewed. No Known Allergies Outpatient Prescriptions Prior to Visit  Medication Sig Dispense Refill  . lansoprazole (PREVACID) 30 MG capsule Take 1 capsule (30 mg total) by mouth daily. 30 capsule 5  . meloxicam (MOBIC) 15 MG tablet Take 1 tablet (15 mg total) by mouth daily. 30 tablet 0  . sucralfate (CARAFATE) 1 G tablet 1 tablet 1 hr ac and hs 120 tablet 0  . NUVARING 0.12-0.015 MG/24HR vaginal ring   11   No facility-administered medications prior to visit.   Past Medical History  Diagnosis Date  . Abdominal pain     Diffuse mild  . C. difficile colitis [id number]    Treated w PO Metronidazole  . Constipation, chronic   . GERD (gastroesophageal reflux disease)   . 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 Hemorrhoid surgery  1990  . Cesarean section  1990  . Cholecystectomy  03/13/2011    lap single site  . Esophagogastroduodenoscopy  05/15/2011    Procedure: ESOPHAGOGASTRODUODENOSCOPY (EGD);  Surgeon: Landry Dyke, MD;  Location: [facility];  Service: Endoscopy;  Laterality: N/A;  . Colonoscopy  05/15/2011    Procedure: COLONOSCOPY;  Surgeon: Landry Dyke, MD;  Location: [facility];  Service: Endoscopy;  Laterality: N/A;   History   Social History  . Marital Status: Married    Spouse Name: N/A  . Number of Children: N/A  . Years of Education: N/A   Social History Main Topics  . Smoking status: Never Smoker   . Smokeless tobacco: Never Used  . Alcohol Use: No  . Drug Use: No  . Sexual Activity: Yes    Birth Control/ Protection: None   Other Topics Concern  . None   Social History Narrative   She is married she has 4 sons and one daughter. 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 Updated 10/26/2014      Family Hx: diabetes,, kidney disease     Review of Systems Positive for those things mentioned in history of present illness. Also somewhat diffusely positive with some fatigue, joint and back pain, pedal edema sore throat recently,  she feels dyspneic sometimes a lot of muscle pain and cramps night sweats itching subjective fever and sinus trouble. All other review of systems are negative.    Objective:   Physical Exam @BP  102/70 mmHg  Pulse 72  Ht 5' 3.25" (1.607 m)  Wt 180 lb (81.647 kg)  BMI 31.62 kg/m2@  General:  Well-developed, well-nourished and in no acute distress - su[place]ese woman Eyes:  anicteric. ENT:   Mouth and posterior pharynx free of lesions.  Neck:   supple w/o thyromegaly or mass.  Lungs: Clear to auscultation bilaterally. Heart:  S1S2, no rubs, murmurs, gallops. Abdomen:  soft, non-tender, no hepatosplenomegaly, hernia, or mass and BS+.  Rectal:  Patti [place],  [place] present.  Anoderm inspection revealed no abnormalities Anal wink was present Digital exam revealed normal resting tone and voluntary squeeze. + mass effect anterior rectal wall - irregular - ? Submucosal, mildly tender - heme + small rectocele present. Simulated defecation with valsalva revealed appropriate abdominal contraction and descent.    Lymph:  no cervical or supraclavicular adenopathy. Extremities:   no edema, cyanosis or clubbing Skin   no rash. Neuro:  A&O x 3.  Psych:  appropriate mood and  Affect.   Data Reviewed: As per history of present illness including hospital H and P and dc summary from 2014 PC notes 2016 She has a mildly positive ANA recently on April 11 by cold with a titer of 1-80 TSH has been running slightly low for years Free T4 normal in April. CBC normal April 11 Sedimentation rate was 59 CT images 12/2013 reviewed also    Assessment & Plan:   1. Constipation, unspecified constipation type   2. Abnormal digital rectal exam   3. Heme + stool   4. Bloating   5. Early satiety    She has a multitude of symptoms that seem functional mostly. I was surprised by the abnormal rectal exam, I think there is an abnormality very either mucosal or submucosal. Was on the anterior wall, a retroverted cervix can be felt there but this was not smooth. She will be prepped for a colonoscopy and have that tomorrow to investigate further.The risks and benefits as well as alternatives of endoscopic procedure(s) have been discussed and reviewed. All questions answered. The patient agrees to proceed.   Upper endoscopy could be needed though again these are chronic symptoms she has so seems unlikely to be of benefit. She is concerned about these symptoms. I don't know she's had an adequate explanation although I explained to her that sometimes we can tell people what it's not as opposed to what it is. I do need to understand if she has a rectal mass or not and we will go  from there. Consider celiac testing if not done in the past and I don't think it has been. Also note that transabdominal and transvaginal pelvic ultrasound in 2015 was unremarkable.  Abnormal autoimmune serologies could be related to her problems as well, await further evaluation by rheumatology she is planning to see Dr. Trudie Reed.  I appreciate the opportunity to care for this patient. Cc: Synetta Shadow, MD and Gavin Pound, MD

## 2014-10-27 ENCOUNTER — Ambulatory Visit (AMBULATORY_SURGERY_CENTER): Payer: BLUE CROSS/BLUE SHIELD | Admitting: Internal Medicine

## 2014-10-27 ENCOUNTER — Encounter: Payer: Self-pay | Admitting: Internal Medicine

## 2014-10-27 VITALS — BP 138/98 | HR 67 | Temp 97.1°F | Resp 19 | Ht 63.0 in | Wt 180.0 lb

## 2014-10-27 DIAGNOSIS — R195 Other fecal abnormalities: Secondary | ICD-10-CM

## 2014-10-27 DIAGNOSIS — Z860101 Personal history of adenomatous and serrated colon polyps: Secondary | ICD-10-CM | POA: Insufficient documentation

## 2014-10-27 DIAGNOSIS — D125 Benign neoplasm of sigmoid colon: Secondary | ICD-10-CM | POA: Diagnosis not present

## 2014-10-27 DIAGNOSIS — Z86010 Personal history of colonic polyps: Secondary | ICD-10-CM

## 2014-10-27 HISTORY — DX: Personal history of adenomatous and serrated colon polyps: Z86.0101

## 2014-10-27 HISTORY — DX: Personal history of colonic polyps: Z86.010

## 2014-10-27 MED ORDER — SODIUM CHLORIDE 0.9 % IV SOLN: 500.0000 mL | INTRAVENOUS | Status: DC

## 2014-10-27 NOTE — Op Note (Signed)
[place]  [address] [place], [address]   COLONOSCOPY PROCEDURE REPORT  PATIENT: Angel Bennett, Angel Bennett  MR#: [id number] BIRTHDATE: 12/17/1965 , 86  yrs. old GENDER: female ENDOSCOPIST: Gatha Mayer, MD, [facility] PROCEDURE DATE:  10/27/2014 PROCEDURE:   Colonoscopy, diagnostic and Colonoscopy with snare polypectomy First Screening Colonoscopy - Avg.  risk and is 50 yrs.  old or older - No.  Prior Negative Screening - Now for repeat screening. N/A  History of Adenoma - Now for follow-up colonoscopy & has been > or = to 3 yrs.  N/A ASA CLASS:   Class II INDICATIONS:Evaluation of unexplained GI bleeding and Patient is not applicable for Colorectal Neoplasm Risk Assessment for this procedure. MEDICATIONS: Propofol 200 mg IV, Lidocaine 150 mg IV, and Monitored anesthesia care  DESCRIPTION OF PROCEDURE:   After the risks benefits and alternatives of the procedure were thoroughly explained, informed consent was obtained.  The digital rectal exam revealed a palpable rectal mass.   The LB [id number]  endoscope was introduced through the anus and advanced to the cecum, which was identified by both the appendix and ileocecal valve. No adverse events experienced.   The quality of the prep was excellent.  (Suprep was used)  The instrument was then slowly withdrawn as the colon was fully examined.      COLON FINDINGS: A sessile polyp measuring 6 mm in size was found in the sigmoid colon.  A polypectomy was performed with a cold snare. The resection was complete, the polyp tissue was completely retrieved and sent to histology.   Firm mass effect anterior to anterior rectal wall in area where vagina/uterus are.   Internal hemorrhoids were found.   The examination was otherwise normal. Retroflexed views revealed internal hemorrhoids. The time to cecum = 3.6 Withdrawal time = 6.2   The scope was withdrawn and the procedure completed. COMPLICATIONS: There were no  immediate complications.  ENDOSCOPIC IMPRESSION: 1.   Sessile polyp was found in the sigmoid colon; polypectomy was performed with a cold snare 2.   Firm mass effect anterior to anterior rectal wall in area where vagina/uterus are 3.   Internal hemorrhoids 4.   The examination was otherwise normal  RECOMMENDATIONS: 1.  GYN evaluation - will arrange - need to sort out what very firm area anterior to rectal wall is 2.  Timing of repeat colonoscopy will be determined by pathology findings.  eSigned:  Gatha Mayer, MD, [facility] 10/27/2014 2:00 PM   cc: Synetta Shadow, MD and The Patient

## 2014-10-27 NOTE — Progress Notes (Signed)
Report to PACU, RN, vss, BBS= Clear.  

## 2014-10-27 NOTE — Progress Notes (Signed)
Called to room to assist during endoscopic procedure.  Patient ID and intended procedure confirmed with present staff. Received instructions for my participation in the procedure from the performing physician.  

## 2014-10-27 NOTE — Patient Instructions (Addendum)
I found and removed one small polyp. It looks benign (not cancer). There are some hemorrhoids.  The abnormality I felt is on the other side of the rectum in the area of the vagina or uterus. I am not sure what this is and will arrange for you to see a gynecologist. My office will set that up.   I appreciate the opportunity to care for you. Gatha Mayer, MD, FACG   YOU HAD AN ENDOSCOPIC PROCEDURE TODAY AT [facility]:   Refer to the procedure report that was given to you for any specific questions about what was found during the examination.  If the procedure report does not answer your questions, please call your gastroenterologist to clarify.  If you requested that your care partner not be given the details of your procedure findings, then the procedure report has been included in a sealed envelope for you to review at your convenience later.  YOU SHOULD EXPECT: Some feelings of bloating in the abdomen. Passage of more gas than usual.  Walking can help get rid of the air that was put into your GI tract during the procedure and reduce the bloating. If you had a lower endoscopy (such as a colonoscopy or flexible sigmoidoscopy) you may notice spotting of blood in your stool or on the toilet paper. If you underwent a bowel prep for your procedure, you may not have a normal bowel movement for a few days.  Please Note:  You might notice some irritation and congestion in your nose or some drainage.  This is from the oxygen used during your procedure.  There is no need for concern and it should clear up in a day or so.  SYMPTOMS TO REPORT IMMEDIATELY:   Following lower endoscopy (colonoscopy or flexible sigmoidoscopy):  Excessive amounts of blood in the stool  Significant tenderness or worsening of abdominal pains  Swelling of the abdomen that is new, acute  Fever of 100F or higher    For urgent or emergent issues, a gastroenterologist can be reached at any hour by  calling [phone].   DIET: Your first meal following the procedure should be a small meal and then it is ok to progress to your normal diet. Heavy or fried foods are harder to digest and may make you feel nauseous or bloated.  Likewise, meals heavy in dairy and vegetables can increase bloating.  Drink plenty of fluids but you should avoid alcoholic beverages for 24 hours.  ACTIVITY:  You should plan to take it easy for the rest of today and you should NOT DRIVE or use heavy machinery until tomorrow (because of the sedation medicines used during the test).    FOLLOW UP: Our staff will call the number listed on your records the next business day following your procedure to check on you and address any questions or concerns that you may have regarding the information given to you following your procedure. If we do not reach you, we will leave a message.  However, if you are feeling well and you are not experiencing any problems, there is no need to return our call.  We will assume that you have returned to your regular daily activities without incident.  If any biopsies were taken you will be contacted by phone or by letter within the next 1-3 weeks.  Please call us at [phone] if you have not heard about the biopsies in 3 weeks.    SIGNATURES/CONFIDENTIALITY: You and/or your  care partner have signed paperwork which will be entered into your electronic medical record.  These signatures attest to the fact that that the information above on your After Visit Summary has been reviewed and is understood.  Full responsibility of the confidentiality of this discharge information lies with you and/or your care-partner.   Information on polyps given to you today  Dr Celesta Aver office will arrange for you to see a gynecologist & call you with this appointment

## 2014-10-28 ENCOUNTER — Telehealth: Payer: Self-pay

## 2014-10-28 NOTE — Telephone Encounter (Signed)
  Follow up Call-  Call back number 10/27/2014  Post procedure Call Back phone  # [phone]  Permission to leave phone message Yes     Patient questions:  Do you have a fever, pain , or abdominal swelling? No. Pain Score  0 *  Have you tolerated food without any problems? Yes.    Have you been able to return to your normal activities? Yes.    Do you have any questions about your discharge instructions: Diet   No. Medications  No. Follow up visit  No.  Do you have questions or concerns about your Care? No.  Actions: * If pain score is 4 or above: No action needed, pain <4.

## 2014-10-31 ENCOUNTER — Ambulatory Visit: Payer: Self-pay | Admitting: Dietician

## 2014-11-08 ENCOUNTER — Encounter: Payer: Self-pay | Admitting: Internal Medicine

## 2014-11-08 DIAGNOSIS — Z86010 Personal history of colonic polyps: Secondary | ICD-10-CM

## 2014-11-08 NOTE — Progress Notes (Signed)
Quick Note:  6 mm adenoma - repeat colonoscopy 2021 ______

## 2014-11-17 ENCOUNTER — Telehealth: Payer: Self-pay | Admitting: Internal Medicine

## 2014-11-17 NOTE — Telephone Encounter (Signed)
I spoke to Dr. Nelda Marseille her GYN. She thinks what I felt was the [employer] birth control device - patient did not think she had anything inserted but we think that was most likely so.  Dr. Vonzella Nipple it would be good for her to have a f/u.  Please forward our GI notes and colonoscopy report to Dr. Nelda Marseille after you speak to patient.

## 2014-11-17 NOTE — Telephone Encounter (Signed)
Left message for patient to call back  

## 2014-11-17 NOTE — Telephone Encounter (Signed)
Patient notified of recommendation.   Colonoscopy and office notes faxed to [phone]

## 2014-12-15 ENCOUNTER — Telehealth: Payer: Self-pay | Admitting: Internal Medicine

## 2014-12-15 ENCOUNTER — Ambulatory Visit (INDEPENDENT_AMBULATORY_CARE_PROVIDER_SITE_OTHER): Payer: BLUE CROSS/BLUE SHIELD | Admitting: Physician Assistant

## 2014-12-15 VITALS — BP 100/68 | HR 73 | Temp 98.4°F | Resp 16 | Ht 63.0 in | Wt 179.2 lb

## 2014-12-15 DIAGNOSIS — K645 Perianal venous thrombosis: Secondary | ICD-10-CM

## 2014-12-15 DIAGNOSIS — K921 Melena: Secondary | ICD-10-CM | POA: Diagnosis not present

## 2014-12-15 DIAGNOSIS — R197 Diarrhea, unspecified: Secondary | ICD-10-CM | POA: Diagnosis not present

## 2014-12-15 DIAGNOSIS — R109 Unspecified abdominal pain: Secondary | ICD-10-CM | POA: Diagnosis not present

## 2014-12-15 DIAGNOSIS — R222 Localized swelling, mass and lump, trunk: Secondary | ICD-10-CM

## 2014-12-15 DIAGNOSIS — R1900 Intra-abdominal and pelvic swelling, mass and lump, unspecified site: Secondary | ICD-10-CM

## 2014-12-15 LAB — POCT CBC
Granulocyte percent: 47.8 %G (ref 37–80)
HCT, POC: 38.5 % (ref 37.7–47.9)
Hemoglobin: 12.3 g/dL (ref 12.2–16.2)
Lymph, poc: 2.4 (ref 0.6–3.4)
MCH, POC: 28.1 pg (ref 27–31.2)
MCHC: 32 g/dL (ref 31.8–35.4)
MCV: 87.7 fL (ref 80–97)
MID (CBC): 0.3 (ref 0–0.9)
MPV: 7.9 fL (ref 0–99.8)
PLATELET COUNT, POC: 272 10*3/uL (ref 142–424)
POC Granulocyte: 2.5 (ref 2–6.9)
POC LYMPH PERCENT: 45.7 %L (ref 10–50)
POC MID %: 6.5 %M (ref 0–12)
RBC: 4.39 M/uL (ref 4.04–5.48)
RDW, POC: 14.9 %
WBC: 5.2 10*3/uL (ref 4.6–10.2)

## 2014-12-15 LAB — POCT URINALYSIS DIPSTICK
BILIRUBIN UA: NEGATIVE
Blood, UA: NEGATIVE
GLUCOSE UA: NEGATIVE
LEUKOCYTES UA: NEGATIVE
NITRITE UA: NEGATIVE
PH UA: 6
PROTEIN UA: NEGATIVE
Spec Grav, UA: 1.025
Urobilinogen, UA: 0.2

## 2014-12-15 LAB — POCT UA - MICROSCOPIC ONLY
Bacteria, U Microscopic: NEGATIVE
CRYSTALS, UR, HPF, POC: NEGATIVE
Casts, Ur, LPF, POC: NEGATIVE
Mucus, UA: NEGATIVE
RBC, URINE, MICROSCOPIC: NEGATIVE
WBC, UR, HPF, POC: NEGATIVE
YEAST UA: NEGATIVE

## 2014-12-15 MED ORDER — LIDOCAINE-HYDROCORTISONE ACE 3-1 % RE KIT: 1.0000 "application " | PACK | Freq: Two times a day (BID) | RECTAL | Status: DC

## 2014-12-15 NOTE — Progress Notes (Signed)
Subjective:    Patient ID: Angel Bennett, female    DOB: [birth year], 49 y.o.   MRN: [id number]  Chief Complaint  Patient presents with  . stomach pain    started a week ago  . Abdominal Cramping    x 1 week  . Diarrhea  . Rectal Bleeding   Patient Active Problem List   Diagnosis Date Noted  . Hx of adenomatous polyp of colon 10/27/2014  . Chronic abdominal pain 04/28/2013  . Partial small bowel obstruction 04/28/2013  . History of Helicobacter pylori infection 04/28/2013  . Chronic cholecystitis 03/06/2011  . GERD (gastroesophageal reflux disease) 03/06/2011  . Constipation, chronic 03/06/2011   Prior to Admission medications   Medication Sig Start Date End Date Taking? Authorizing Provider  lansoprazole (PREVACID) 30 MG capsule Take 1 capsule (30 mg total) by mouth daily. 09/13/14  Yes Roselee Culver, MD  polyethylene glycol powder (GLYCOLAX/MIRALAX) powder Take 1 Container by mouth once.    Historical Provider, MD  sucralfate (CARAFATE) 1 G tablet 1 tablet 1 hr ac and hs Patient not taking: Reported on 10/27/2014 09/13/14   Roselee Culver, MD   Medications, allergies, past medical history, surgical history, family history, social history and problem list reviewed and updated.  HPI  65 yof presents with abd cramping, diarrhea, blood in stool.   Sx started one wk ago with generalized abd cramping. This cramping is persistent but worsens with meals or with BMs. Generalized, diffuse. She thinks it is slightly worse across upper abd. Has also had diarrhea for past week. She is currently fasting during the day and eating at night. After her night time meals she is having the diarrhea. Had blood in toilet bowl and on toilet paper last night after diarrhea episode.   Denies sick contacts, recent travel, recent antibiotics. Denies fevers. Denies dysuria, hematuria, vaginal dc, odor. Not having epigastric burning or reflux after meals.   Brief hx: Was seen here 3/16 with upper  abd pain, diagnosed with gastritis/gerd. She takes prevacid 30 mg qd. She had a colonoscopy 2 months ago. Had one adenoma removed with instructions to repeat 5 yrs. Per the colonoscopy note states she had a firm area anterior rectal wall. GI note states they would like gyn f/u. Today she states she did see her gyn and had an US done which was normal. Also noted to have internal hemorrhoids. Per note in Epic she called her GI this am and Dr Carlean Purl replied stating her sx could be from transient ischemic colitis and for her to come to [facility] to be seen.   Review of Systems See HPI.     Objective:   Physical Exam  Constitutional: She is oriented to person, place, and time. She appears well-developed and well-nourished.  Non-toxic appearance. She does not have a sickly appearance. She does not appear ill. No distress.  BP 100/68 mmHg  Pulse 73  Temp(Src) 98.4 F (36.9 C) (Oral)  Resp 16  Ht '5\' 3"'  (1.6 m)  Wt 179 lb 3.2 oz (81.285 kg)  BMI 31.75 kg/m2  SpO2 99%   Pulmonary/Chest: Breath sounds normal.  Abdominal: Soft. Normal appearance and bowel sounds are normal. There is tenderness in the right upper quadrant and left upper quadrant. There is no rigidity, no rebound, no guarding, no CVA tenderness, no tenderness at McBurney's point and negative Murphy's sign.  Genitourinary: Vagina normal. Rectal exam shows external hemorrhoid. Rectal exam shows no internal hemorrhoid and anal tone normal. Uterus is  not tender. Cervix exhibits no motion tenderness and no discharge. Right adnexum displays no mass, no tenderness and no fullness. Left adnexum displays no mass, no tenderness and no fullness. No tenderness in the vagina. No vaginal discharge found.  Two thrombosed external hemorrhoids. Not ttp. Not engorged appearing.   Neurological: She is alert and oriented to person, place, and time.  Skin: Skin is warm and dry. No rash noted.  Psychiatric: She has a normal mood and affect. Her speech is normal and  behavior is normal.   Results for orders placed or performed in visit on 12/15/14  POCT urinalysis dipstick  Result Value Ref Range   Color, UA yellow    Clarity, UA clear    Glucose, UA neg    Bilirubin, UA neg    Ketones, UA tr    Spec Grav, UA 1.025    Blood, UA neg    pH, UA 6.0    Protein, UA neg    Urobilinogen, UA 0.2    Nitrite, UA neg    Leukocytes, UA Negative Negative  POCT UA - Microscopic Only  Result Value Ref Range   WBC, Ur, HPF, POC neg    RBC, urine, microscopic neg    Bacteria, U Microscopic neg    Mucus, UA neg    Epithelial cells, urine per micros 0-4    Crystals, Ur, HPF, POC neg    Casts, Ur, LPF, POC neg    Yeast, UA neg   POCT CBC  Result Value Ref Range   WBC 5.2 4.6 - 10.2 K/uL   Lymph, poc 2.4 0.6 - 3.4   POC LYMPH PERCENT 45.7 10 - 50 %L   MID (cbc) 0.3 0 - 0.9   POC MID % 6.5 0 - 12 %M   POC Granulocyte 2.5 2 - 6.9   Granulocyte percent 47.8 37 - 80 %G   RBC 4.39 4.04 - 5.48 M/uL   Hemoglobin 12.3 12.2 - 16.2 g/dL   HCT, POC 38.5 37.7 - 47.9 %   MCV 87.7 80 - 97 fL   MCH, POC 28.1 27 - 31.2 pg   MCHC 32.0 31.8 - 35.4 g/dL   RDW, POC 14.9 %   Platelet Count, POC 272 142 - 424 K/uL   MPV 7.9 0 - 99.8 fL       Assessment & Plan:    23 yof presents with abd cramping, diarrhea, blood in stool.   Abdominal cramping - Plan: POCT urinalysis dipstick, POCT UA - Microscopic Only, Comprehensive metabolic panel, Lipase --no leukocytosis, normal ua --normal pelvic exam today --mild ruq and luq ttp, no rebound, no guarding --cmp, lipase today --continue with prevacid 30 mg qd --bland diet during ramadan  Diarrhea - Plan: POCT CBC Blood in stool  --stool kit given for pt to take home today --when returns sample will likely check stool cx, lactoferrin, o & p --> future orders have been placed  Hemorrhoid thrombosis --increase fiber, healthy bowel instructions given, sitz baths --lidocaine-hydrocortisone cream rectally bid  Abdominal  wall mass --as noted from Dr Carlean Purl in colonoscopy report --recently followed with her gyn who per pt did an [place] which was normal  Julieta Gutting, PA-C Physician Assistant-Certified [facility]  12/15/2014 6:37 PM

## 2014-12-15 NOTE — Telephone Encounter (Signed)
Patient notified of recommendations. 

## 2014-12-15 NOTE — Telephone Encounter (Signed)
Patient reports one week of lower abdominal cramping and more frequent BM about 3 times a day, she is usually constipated.  She reports that sometimes the stool is watery.  She reports that she is also having rectal pain and bleeding that started last night after a BM.  Minimal bleeding this am after a BM and on the tissue.  She reports that she is very uncomfortable from the cramping.  She denies antibiotics, recent travel or sick contacts.  Dr. Carlean Purl please advise

## 2014-12-15 NOTE — Telephone Encounter (Signed)
Ask her to go to [facility] to be seen - she is a patient there I think she could have developed transient ischemic colitis  If she feels really bad then would need to go to ED  She can tell the PA or MD she sees there to page me with any ?

## 2014-12-15 NOTE — Patient Instructions (Signed)
Your white blood cell count was not elevated today which is reassuring.  Your urine sample was normal today.  You do have hemorrhoids which may have caused the blood in the stool. Please apply the topical twice daily.  Please take a sitz bath 1-2 times per day, increase the fiber in your diet with increased water intake, and refrain from sitting on the toilet too long during your bowel movements.  Please obtain a stool sample and bring it back to [place]. We will check this for infection.  Try to eat a bland diet to see if that helps with the symptoms. We are checking more labs today and will let you know these results.  If the symptoms persist or worsen please be sure to follow up with Dr Carlean Purl asap.

## 2014-12-16 LAB — COMPREHENSIVE METABOLIC PANEL
ALK PHOS: 78 U/L (ref 39–117)
ALT: 9 U/L (ref 0–35)
AST: 19 U/L (ref 0–37)
Albumin: 3.9 g/dL (ref 3.5–5.2)
BUN: 7 mg/dL (ref 6–23)
CO2: 28 mEq/L (ref 19–32)
Calcium: 9.1 mg/dL (ref 8.4–10.5)
Chloride: 102 mEq/L (ref 96–112)
Creat: 0.66 mg/dL (ref 0.50–1.10)
Glucose, Bld: 80 mg/dL (ref 70–99)
POTASSIUM: 3.8 meq/L (ref 3.5–5.3)
SODIUM: 138 meq/L (ref 135–145)
TOTAL PROTEIN: 7.6 g/dL (ref 6.0–8.3)
Total Bilirubin: 0.4 mg/dL (ref 0.2–1.2)

## 2014-12-16 LAB — LIPASE: Lipase: 19 U/L (ref 0–75)

## 2014-12-17 NOTE — Addendum Note (Signed)
Addended by: Lupe Carney on: 12/17/2014 12:19 PM   Modules accepted: Orders

## 2014-12-18 LAB — FECAL LACTOFERRIN, QUANT: Lactoferrin: NEGATIVE

## 2014-12-20 LAB — OVA AND PARASITE EXAMINATION: OP: NONE SEEN

## 2014-12-21 LAB — STOOL CULTURE

## 2014-12-22 ENCOUNTER — Other Ambulatory Visit: Payer: Self-pay | Admitting: Family Medicine

## 2014-12-22 DIAGNOSIS — N6320 Unspecified lump in the left breast, unspecified quadrant: Secondary | ICD-10-CM

## 2014-12-30 ENCOUNTER — Other Ambulatory Visit: Payer: BLUE CROSS/BLUE SHIELD

## 2015-03-01 ENCOUNTER — Other Ambulatory Visit: Payer: BLUE CROSS/BLUE SHIELD

## 2015-03-12 ENCOUNTER — Ambulatory Visit (INDEPENDENT_AMBULATORY_CARE_PROVIDER_SITE_OTHER): Payer: BLUE CROSS/BLUE SHIELD | Admitting: Family Medicine

## 2015-03-12 ENCOUNTER — Ambulatory Visit: Payer: BLUE CROSS/BLUE SHIELD

## 2015-03-12 VITALS — BP 108/70 | HR 68 | Temp 97.8°F | Resp 18 | Ht 63.0 in | Wt 180.0 lb

## 2015-03-12 DIAGNOSIS — J069 Acute upper respiratory infection, unspecified: Secondary | ICD-10-CM

## 2015-03-12 DIAGNOSIS — B360 Pityriasis versicolor: Secondary | ICD-10-CM | POA: Diagnosis not present

## 2015-03-12 DIAGNOSIS — R05 Cough: Secondary | ICD-10-CM | POA: Diagnosis not present

## 2015-03-12 DIAGNOSIS — R059 Cough, unspecified: Secondary | ICD-10-CM

## 2015-03-12 DIAGNOSIS — R21 Rash and other nonspecific skin eruption: Secondary | ICD-10-CM | POA: Diagnosis not present

## 2015-03-12 MED ORDER — FLUCONAZOLE 150 MG PO TABS: 150.0000 mg | ORAL_TABLET | Freq: Once | ORAL | Status: DC

## 2015-03-12 MED ORDER — TRIAMCINOLONE ACETONIDE 0.1 % EX CREA: 1.0000 "application " | TOPICAL_CREAM | Freq: Two times a day (BID) | CUTANEOUS | Status: DC

## 2015-03-12 NOTE — Patient Instructions (Signed)
Your rash may be due either to a fungus, or a contact dermatitis due to the new detergent. If this is fungus, because of the various areas of involvement, we can try treatment with an oral medication called Diflucan. Take 1 today then repeat in 1 week if needed. For itching areas you can apply the triamcinolone steroid cream up to twice per day as needed.  Recommend changing detergent to dye free and perfume free mild detergent as well as mild fabric softener for now. If rash spreads down arms especially in the hands, or not improving this week, return for other possible treatment, including treatment for less likely scabies.  For your cough, this appears to be an upper respiratory infection that is usually due to a virus. Your exam is reassuring, and as your symptoms are improving I expect you to continue to improve. See information below on this condition, okay to use Mucinex over-the-counter if needed, drink plenty of fluids, and return if any worsening.  Tinea Versicolor Tinea versicolor is a common yeast infection of the skin. This condition becomes known when the yeast on our skin starts to overgrow (yeast is a normal inhabitant on our skin). This condition is noticed as white or light brown patches on brown skin, and is more evident in the summer on tanned skin. These areas are slightly scaly if scratched. The light patches from the yeast become evident when the yeast creates "holes in your suntan". This is most often noticed in the summer. The patches are usually located on the chest, back, pubis, neck and body folds. However, it may occur on any area of body. Mild itching and inflammation (redness or soreness) may be present. DIAGNOSIS  The diagnosisof this is made clinically (by looking). Cultures from samples are usually not needed. Examination under the microscope may help. However, yeast is normally found on skin. The diagnosis still remains clinical. Examination under Wood's Ultraviolet Light  can determine the extent of the infection. TREATMENT  This common infection is usually only of cosmetic (only a concern to your appearance). It is easily treated with dandruff shampoo used during showers or bathing. Vigorous scrubbing will eliminate the yeast over several days time. The light areas in your skin may remain for weeks or months after the infection is cured unless your skin is exposed to sunlight. The lighter or darker spots caused by the fungus that remain after complete treatment are not a sign of treatment failure; it will take a long time to resolve. Your caregiver may recommend a number of commercial preparations or medication by mouth if home care is not working. Recurrence is common and preventative medication may be necessary. This skin condition is not highly contagious. Special care is not needed to protect close friends and family members. Normal hygiene is usually enough. Follow up is required only if you develop complications (such as a secondary infection from scratching), if recommended by your caregiver, or if no relief is obtained from the preparations used. Document Released: 06/14/2000 Document Revised: 09/09/2011 Document Reviewed: 07/27/2008 [facility] Patient Information 2015 [place], [place]. This information is not intended to replace advice given to you by your health care provider. Make sure you discuss any questions you have with your health care provider.   Upper Respiratory Infection, Adult An upper respiratory infection (URI) is also sometimes known as the common cold. The upper respiratory tract includes the nose, sinuses, throat, trachea, and bronchi. Bronchi are the airways leading to the lungs. Most people improve within 1  week, but symptoms can last up to 2 weeks. A residual cough may last even longer.  CAUSES Many different viruses can infect the tissues lining the upper respiratory tract. The tissues become irritated and inflamed and often become very moist.  Mucus production is also common. A cold is contagious. You can easily spread the virus to others by oral contact. This includes kissing, sharing a glass, coughing, or sneezing. Touching your mouth or nose and then touching a surface, which is then touched by another person, can also spread the virus. SYMPTOMS  Symptoms typically develop 1 to 3 days after you come in contact with a cold virus. Symptoms vary from person to person. They may include:  Runny nose.  Sneezing.  Nasal congestion.  Sinus irritation.  Sore throat.  Loss of voice (laryngitis).  Cough.  Fatigue.  Muscle aches.  Loss of appetite.  Headache.  Low-grade fever. DIAGNOSIS  You might diagnose your own cold based on familiar symptoms, since most people get a cold 2 to 3 times a year. Your caregiver can confirm this based on your exam. Most importantly, your caregiver can check that your symptoms are not due to another disease such as strep throat, sinusitis, pneumonia, asthma, or epiglottitis. Blood tests, throat tests, and X-rays are not necessary to diagnose a common cold, but they may sometimes be helpful in excluding other more serious diseases. Your caregiver will decide if any further tests are required. RISKS AND COMPLICATIONS  You may be at risk for a more severe case of the common cold if you smoke cigarettes, have chronic heart disease (such as heart failure) or lung disease (such as asthma), or if you have a weakened immune system. The very young and very old are also at risk for more serious infections. Bacterial sinusitis, middle ear infections, and bacterial pneumonia can complicate the common cold. The common cold can worsen asthma and chronic obstructive pulmonary disease (COPD). Sometimes, these complications can require emergency medical care and may be life-threatening. PREVENTION  The best way to protect against getting a cold is to practice good hygiene. Avoid oral or hand contact with people with  cold symptoms. Wash your hands often if contact occurs. There is no clear evidence that vitamin C, vitamin E, echinacea, or exercise reduces the chance of developing a cold. However, it is always recommended to get plenty of rest and practice good nutrition. TREATMENT  Treatment is directed at relieving symptoms. There is no cure. Antibiotics are not effective, because the infection is caused by a virus, not by bacteria. Treatment may include:  Increased fluid intake. Sports drinks offer valuable electrolytes, sugars, and fluids.  Breathing heated mist or steam (vaporizer or shower).  Eating chicken soup or other clear broths, and maintaining good nutrition.  Getting plenty of rest.  Using gargles or lozenges for comfort.  Controlling fevers with ibuprofen or acetaminophen as directed by your caregiver.  Increasing usage of your inhaler if you have asthma. Zinc gel and zinc lozenges, taken in the first 24 hours of the common cold, can shorten the duration and lessen the severity of symptoms. Pain medicines may help with fever, muscle aches, and throat pain. A variety of non-prescription medicines are available to treat congestion and runny nose. Your caregiver can make recommendations and may suggest nasal or lung inhalers for other symptoms.  HOME CARE INSTRUCTIONS   Only take over-the-counter or prescription medicines for pain, discomfort, or fever as directed by your caregiver.  Use a warm mist humidifier  or inhale steam from a shower to increase air moisture. This may keep secretions moist and make it easier to breathe.  Drink enough water and fluids to keep your urine clear or pale yellow.  Rest as needed.  Return to work when your temperature has returned to normal or as your caregiver advises. You may need to stay home longer to avoid infecting others. You can also use a face mask and careful hand washing to prevent spread of the virus. SEEK MEDICAL CARE IF:   After the first  few days, you feel you are getting worse rather than better.  You need your caregiver's advice about medicines to control symptoms.  You develop chills, worsening shortness of breath, or brown or red sputum. These may be signs of pneumonia.  You develop yellow or brown nasal discharge or pain in the face, especially when you bend forward. These may be signs of sinusitis.  You develop a fever, swollen neck glands, pain with swallowing, or white areas in the back of your throat. These may be signs of strep throat. SEEK IMMEDIATE MEDICAL CARE IF:   You have a fever.  You develop severe or persistent headache, ear pain, sinus pain, or chest pain.  You develop wheezing, a prolonged cough, cough up blood, or have a change in your usual mucus (if you have chronic lung disease).  You develop sore muscles or a stiff neck. Document Released: 12/11/2000 Document Revised: 09/09/2011 Document Reviewed: 09/22/2013 [facility] Patient Information 2015 [place], [place]. This information is not intended to replace advice given to you by your health care provider. Make sure you discuss any questions you have with your health care provider.

## 2015-03-12 NOTE — Progress Notes (Signed)
Subjective:  This chart was scribed for Merri Ray, MD by Thea Alken, ED Scribe. This patient was seen in room 12 and the patient's care was started at 2:08 PM.   Patient ID: Angel Bennett, female    DOB: 1965-09-29, 49 y.o.   MRN: [id number]  HPI   Chief Complaint  Patient presents with  . Rash    over body x3 days very itchy  . Cough    x1 week     HPI Comments: Angel Bennett is a 49 y.o. female who presents to the [facility] complaining of an unchanged, itchy rash along her back, legs, fingers and chest for 1 week. 3 people at home have this rash but have not been treated for it. Pt may have used a new detergent a week ago, but states they by whatever is on sale. She has not tried treating the rash at home. She denies doing yard work or walking through [employer].  She denies fever, rash on genitals.  She also has cough and nasal congestion for 1 week. She has tried nyquil and dayquil without relief. Pt has had sick contacts including her son, who's symptoms have gotten better.   Patient Active Problem List   Diagnosis Date Noted  . Hx of adenomatous polyp of colon 10/27/2014  . Chronic abdominal pain 04/28/2013  . Partial small bowel obstruction 04/28/2013  . History of Helicobacter pylori infection 04/28/2013  . Chronic cholecystitis 03/06/2011  . GERD (gastroesophageal reflux disease) 03/06/2011  . Constipation, chronic 03/06/2011   Past Medical History  Diagnosis Date  . Abdominal pain     Diffuse mild  . C. difficile colitis [id number]    Treated w PO Metronidazole  . Constipation, chronic   . GERD (gastroesophageal reflux disease)   . 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 Hx of adenomatous polyp of colon 10/27/2014   Past Surgical History  Procedure Laterality Date  . Hemorrhoid surgery  1990  . Cesarean section  1990  . Cholecystectomy  03/13/2011    lap single site  . Esophagogastroduodenoscopy  05/15/2011    Procedure:  ESOPHAGOGASTRODUODENOSCOPY (EGD);  Surgeon: Landry Dyke, MD;  Location: [facility];  Service: Endoscopy;  Laterality: N/A;  . Colonoscopy  05/15/2011    Procedure: COLONOSCOPY;  Surgeon: Landry Dyke, MD;  Location: [facility];  Service: Endoscopy;  Laterality: N/A;   No Known Allergies Prior to Admission medications   Medication Sig Start Date End Date Taking? Authorizing Provider  lansoprazole (PREVACID) 30 MG capsule Take 1 capsule (30 mg total) by mouth daily. 09/13/14  Yes Roselee Culver, MD  polyethylene glycol powder (GLYCOLAX/MIRALAX) powder Take 1 Container by mouth once.   Yes Historical Provider, MD  lidocaine-hydrocortisone (ANAMANTLE) 3-1 % KIT Place 1 application rectally 2 (two) times daily. Patient not taking: Reported on 03/12/2015 12/15/14   Araceli Bouche, PA  sucralfate (CARAFATE) 1 G tablet 1 tablet 1 hr ac and hs Patient not taking: Reported on 10/27/2014 09/13/14   Roselee Culver, MD   Social History   Social History  . Marital Status: Married    Spouse Name: N/A  . Number of Children: N/A  . Years of Education: N/A   Occupational History  . Not on file.   Social History Main Topics  . Smoking status: Never Smoker   . Smokeless tobacco: Never Used  . Alcohol Use: No  . Drug Use: No  .  Sexual Activity: Yes    Birth Control/ Protection: None   Other Topics Concern  . Not on file   Social History Narrative   She is married she has 4 sons and one daughter. 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 Updated 10/26/2014      Review of Systems  Constitutional: Negative for fever and chills.  HENT: Positive for congestion.   Respiratory: Positive for cough.   Skin: Positive for rash.    Objective:   Physical Exam  Constitutional: She is oriented to person, place, and time. She appears well-developed and well-nourished. No distress.  HENT:  Head: Normocephalic and atraumatic.  Right Ear: Hearing, tympanic  membrane, external ear and ear canal normal.  Left Ear: Hearing, tympanic membrane, external ear and ear canal normal.  Nose: Mucosal edema ( miinimal) present. No rhinorrhea.  Mouth/Throat: Oropharynx is clear and moist. No oropharyngeal exudate or posterior oropharyngeal erythema.  Eyes: Conjunctivae and EOM are normal. Pupils are equal, round, and reactive to light.  Cardiovascular: Normal rate, regular rhythm, normal heart sounds and intact distal pulses.   No murmur heard. Pulmonary/Chest: Effort normal and breath sounds normal. No respiratory distress. She has no wheezes. She has no rhonchi.  Neurological: She is alert and oriented to person, place, and time.  Skin: Skin is warm and dry. No rash noted.  Excoriated patches on lumbar aspect of back. slightly hyperpigmented/scaly. Few small papular lesions on left wrist  No interdigital lesions. Slight erythema with excoriation under breast line. No plantar rash. No interdigital lesion of the feet. Excoriations on the in inner thigh but no rash otherwise.   Psychiatric: She has a normal mood and affect. Her behavior is normal.  Vitals reviewed.  Filed Vitals:   03/12/15 1338  BP: 108/70  Pulse: 68  Temp: 97.8 F (36.6 C)  Resp: 18   Assessment & Plan:   Angel Bennett is a 49 y.o. female Tinea versicolor, Rash and nonspecific skin eruption - Plan: fluconazole (DIFLUCAN) 150 MG tablet, triamcinolone cream (KENALOG) 0.1 % - Plan: fluconazole (DIFLUCAN) 150 MG tablet, triamcinolone cream (KENALOG) 0.1 %  -Suspected tinea versicolor based on location of rash, less likely contact dermatitis with other family members with similar symptoms, or less likely scabies has no interdigital involvement and area involvement appears to be typical areas of fungal infection.   -Diflucan 150 mg by mouth times 1 repeat in 1 week if needed. Triamcinolone topical twice a day when necessary, and change to mild detergent and fabric softener.  -If rash continues  to spread, or more interdigital lesions, would consider treatment for scabies.  Cough, Acute upper respiratory infection  -Improving, reassuring exam. Symptomatic care discussed and handout provided. RTC precautions   Meds ordered this encounter  Medications  . fluconazole (DIFLUCAN) 150 MG tablet    Sig: Take 1 tablet (150 mg total) by mouth once. Repeat in 1 week if needed    Dispense:  1 tablet    Refill:  1  . triamcinolone cream (KENALOG) 0.1 %    Sig: Apply 1 application topically 2 (two) times daily.    Dispense:  30 g    Refill:  0   Patient Instructions  Your rash may be due either to a fungus, or a contact dermatitis due to the new detergent. If this is fungus, because of the various areas of involvement, we can try treatment with an oral medication called Diflucan. Take 1 today then repeat in 1  week if needed. For itching areas you can apply the triamcinolone steroid cream up to twice per day as needed.  Recommend changing detergent to dye free and perfume free mild detergent as well as mild fabric softener for now. If rash spreads down arms especially in the hands, or not improving this week, return for other possible treatment, including treatment for less likely scabies.  For your cough, this appears to be an upper respiratory infection that is usually due to a virus. Your exam is reassuring, and as your symptoms are improving I expect you to continue to improve. See information below on this condition, okay to use Mucinex over-the-counter if needed, drink plenty of fluids, and return if any worsening.  Tinea Versicolor Tinea versicolor is a common yeast infection of the skin. This condition becomes known when the yeast on our skin starts to overgrow (yeast is a normal inhabitant on our skin). This condition is noticed as white or light brown patches on brown skin, and is more evident in the summer on tanned skin. These areas are slightly scaly if scratched. The light patches  from the yeast become evident when the yeast creates "holes in your suntan". This is most often noticed in the summer. The patches are usually located on the chest, back, pubis, neck and body folds. However, it may occur on any area of body. Mild itching and inflammation (redness or soreness) may be present. DIAGNOSIS  The diagnosisof this is made clinically (by looking). Cultures from samples are usually not needed. Examination under the microscope may help. However, yeast is normally found on skin. The diagnosis still remains clinical. Examination under [employer]'s Ultraviolet Light can determine the extent of the infection. TREATMENT  This common infection is usually only of cosmetic (only a concern to your appearance). It is easily treated with dandruff shampoo used during showers or bathing. Vigorous scrubbing will eliminate the yeast over several days time. The light areas in your skin may remain for weeks or months after the infection is cured unless your skin is exposed to sunlight. The lighter or darker spots caused by the fungus that remain after complete treatment are not a sign of treatment failure; it will take a long time to resolve. Your caregiver may recommend a number of commercial preparations or medication by mouth if home care is not working. Recurrence is common and preventative medication may be necessary. This skin condition is not highly contagious. Special care is not needed to protect close friends and family members. Normal hygiene is usually enough. Follow up is required only if you develop complications (such as a secondary infection from scratching), if recommended by your caregiver, or if no relief is obtained from the preparations used. Document Released: 06/14/2000 Document Revised: 09/09/2011 Document Reviewed: 07/27/2008 [facility] Patient Information 2015 [place], [place]. This information is not intended to replace advice given to you by your health care provider. Make sure you  discuss any questions you have with your health care provider.   Upper Respiratory Infection, Adult An upper respiratory infection (URI) is also sometimes known as the common cold. The upper respiratory tract includes the nose, sinuses, throat, trachea, and bronchi. Bronchi are the airways leading to the lungs. Most people improve within 1 week, but symptoms can last up to 2 weeks. A residual cough may last even longer.  CAUSES Many different viruses can infect the tissues lining the upper respiratory tract. The tissues become irritated and inflamed and often become very moist. Mucus production is also  common. A cold is contagious. You can easily spread the virus to others by oral contact. This includes kissing, sharing a glass, coughing, or sneezing. Touching your mouth or nose and then touching a surface, which is then touched by another person, can also spread the virus. SYMPTOMS  Symptoms typically develop 1 to 3 days after you come in contact with a cold virus. Symptoms vary from person to person. They may include:  Runny nose.  Sneezing.  Nasal congestion.  Sinus irritation.  Sore throat.  Loss of voice (laryngitis).  Cough.  Fatigue.  Muscle aches.  Loss of appetite.  Headache.  Low-grade fever. DIAGNOSIS  You might diagnose your own cold based on familiar symptoms, since most people get a cold 2 to 3 times a year. Your caregiver can confirm this based on your exam. Most importantly, your caregiver can check that your symptoms are not due to another disease such as strep throat, sinusitis, pneumonia, asthma, or epiglottitis. Blood tests, throat tests, and X-rays are not necessary to diagnose a common cold, but they may sometimes be helpful in excluding other more serious diseases. Your caregiver will decide if any further tests are required. RISKS AND COMPLICATIONS  You may be at risk for a more severe case of the common cold if you smoke cigarettes, have chronic heart  disease (such as heart failure) or lung disease (such as asthma), or if you have a weakened immune system. The very young and very old are also at risk for more serious infections. Bacterial sinusitis, middle ear infections, and bacterial pneumonia can complicate the common cold. The common cold can worsen asthma and chronic obstructive pulmonary disease (COPD). Sometimes, these complications can require emergency medical care and may be life-threatening. PREVENTION  The best way to protect against getting a cold is to practice good hygiene. Avoid oral or hand contact with people with cold symptoms. Wash your hands often if contact occurs. There is no clear evidence that vitamin C, vitamin E, echinacea, or exercise reduces the chance of developing a cold. However, it is always recommended to get plenty of rest and practice good nutrition. TREATMENT  Treatment is directed at relieving symptoms. There is no cure. Antibiotics are not effective, because the infection is caused by a virus, not by bacteria. Treatment may include:  Increased fluid intake. Sports drinks offer valuable electrolytes, sugars, and fluids.  Breathing heated mist or steam (vaporizer or shower).  Eating chicken soup or other clear broths, and maintaining good nutrition.  Getting plenty of rest.  Using gargles or lozenges for comfort.  Controlling fevers with ibuprofen or acetaminophen as directed by your caregiver.  Increasing usage of your inhaler if you have asthma. Zinc gel and zinc lozenges, taken in the first 24 hours of the common cold, can shorten the duration and lessen the severity of symptoms. Pain medicines may help with fever, muscle aches, and throat pain. A variety of non-prescription medicines are available to treat congestion and runny nose. Your caregiver can make recommendations and may suggest nasal or lung inhalers for other symptoms.  HOME CARE INSTRUCTIONS   Only take over-the-counter or prescription  medicines for pain, discomfort, or fever as directed by your caregiver.  Use a warm mist humidifier or inhale steam from a shower to increase air moisture. This may keep secretions moist and make it easier to breathe.  Drink enough water and fluids to keep your urine clear or pale yellow.  Rest as needed.  Return to work when your  temperature has returned to normal or as your caregiver advises. You may need to stay home longer to avoid infecting others. You can also use a face mask and careful hand washing to prevent spread of the virus. SEEK MEDICAL CARE IF:   After the first few days, you feel you are getting worse rather than better.  You need your caregiver's advice about medicines to control symptoms.  You develop chills, worsening shortness of breath, or brown or red sputum. These may be signs of pneumonia.  You develop yellow or brown nasal discharge or pain in the face, especially when you bend forward. These may be signs of sinusitis.  You develop a fever, swollen neck glands, pain with swallowing, or white areas in the back of your throat. These may be signs of strep throat. SEEK IMMEDIATE MEDICAL CARE IF:   You have a fever.  You develop severe or persistent headache, ear pain, sinus pain, or chest pain.  You develop wheezing, a prolonged cough, cough up blood, or have a change in your usual mucus (if you have chronic lung disease).  You develop sore muscles or a stiff neck. Document Released: 12/11/2000 Document Revised: 09/09/2011 Document Reviewed: 09/22/2013 [facility] Patient Information 2015 [place], [place]. This information is not intended to replace advice given to you by your health care provider. Make sure you discuss any questions you have with your health care provider.     I personally performed the services described in this documentation, which was scribed in my presence. The recorded information has been reviewed and considered, and addended by me as  needed.

## 2015-03-13 ENCOUNTER — Ambulatory Visit
Admission: RE | Admit: 2015-03-13 | Discharge: 2015-03-13 | Disposition: A | Discharge status: Home / Self Care | Payer: BLUE CROSS/BLUE SHIELD | Source: Ambulatory Visit | Attending: Family Medicine | Admitting: Family Medicine

## 2015-03-13 DIAGNOSIS — N6320 Unspecified lump in the left breast, unspecified quadrant: Secondary | ICD-10-CM

## 2015-03-14 ENCOUNTER — Other Ambulatory Visit: Payer: BLUE CROSS/BLUE SHIELD

## 2015-03-19 ENCOUNTER — Ambulatory Visit (INDEPENDENT_AMBULATORY_CARE_PROVIDER_SITE_OTHER): Payer: BLUE CROSS/BLUE SHIELD | Admitting: Physician Assistant

## 2015-03-19 VITALS — BP 118/62 | HR 79 | Temp 98.3°F | Resp 18 | Ht 63.25 in | Wt 180.6 lb

## 2015-03-19 DIAGNOSIS — R21 Rash and other nonspecific skin eruption: Secondary | ICD-10-CM | POA: Diagnosis not present

## 2015-03-19 DIAGNOSIS — L299 Pruritus, unspecified: Secondary | ICD-10-CM | POA: Diagnosis not present

## 2015-03-19 MED ORDER — PERMETHRIN 5 % EX CREA: 1.0000 "application " | TOPICAL_CREAM | Freq: Once | CUTANEOUS | Status: DC

## 2015-03-19 MED ORDER — HYDROXYZINE HCL 25 MG PO TABS: 12.5000 mg | ORAL_TABLET | Freq: Three times a day (TID) | ORAL | Status: DC | PRN

## 2015-03-19 NOTE — Progress Notes (Signed)
   Subjective:    Patient ID: Angel Bennett, female    DOB: 1965/09/04, 49 y.o.   MRN: [id number]  Chief Complaint  Patient presents with  . Pruritis    itching all over body for the last 10 days.    Medications, allergies, past medical history, surgical history, family history, social history and problem list reviewed and updated.  HPI  49 yof returns for continued itching.   Seen in our clinic 7 days ago. Had itchy rash at that time for about one week prior. Diagnosed with possible tinea versicolor started on triamcinolone 0.1% topical bid. Today she states she has been applying this as prescribed along with taking the diflucan. This has not improved the itching at all. Per prior note there was some slight concern for scabies and thought of possibly treating if itchy rash persisted or spread.  Today she states rash has spread from lumbar and bilateral arms to chest, abdomen, and bilateral legs. States her son who lives in home also still itchy. Denies fevers, chills. She has gone back to a laundry detergent that she previously used without trouble.   Looking through notes she had normal cmp and cbc recently. Had abnormal rheum labs and was referred to rheum. Today pt states she saw them and had further labs drawn without any issue so far. She will be seeing them again she thinks.   Review of Systems See HPI     Objective:   Physical Exam  Constitutional: She is oriented to person, place, and time. She appears well-developed and well-nourished.  Non-toxic appearance. She does not have a sickly appearance. She does not appear ill. No distress.  BP 118/62 mmHg  Pulse 79  Temp(Src) 98.3 F (36.8 C) (Oral)  Resp 18  Ht 5' 3.25" (1.607 m)  Wt 180 lb 9.6 oz (81.92 kg)  BMI 31.72 kg/m2  SpO2 96%   Neurological: She is alert and oriented to person, place, and time.  Skin:  Multiple excoriated papular lesions along bilateral arms, right flank, upper chest. Similar lesions left leg. One  approx 1cm hive right forearm.   Psychiatric: She has a normal mood and affect. Her speech is normal and behavior is normal.      Assessment & Plan:   Itching - Plan: hydrOXYzine (ATARAX/VISTARIL) 25 MG tablet, permethrin (ELIMITE) 5 % cream, DISCONTINUED: permethrin (ELIMITE) 5 % cream  Rash and nonspecific skin eruption --possible scabies with expanding papular lesions though still no webbing involvement --permethrin, wash clothing/bedding in hot water, get son seen and treated --hydroxyzine for itching --recommended making sure to f/u with rheum to ensure they are ok and no rheumatologic cause of the pruritis  Julieta Gutting, PA-C Physician Assistant-Certified [facility]  03/19/2015 4:36 PM

## 2015-03-19 NOTE — Patient Instructions (Signed)
Please apply the permethrin topical all over the body from neck down. Wait 8-14 hours then shower off.  This should to get rid of possible scabies.  You will need to also get your son seen and treated and wash in hot water bedding and clothing.  You can take the hydroxyzine every 8 hours as needed for itching. Please come back to see [place] if this doesn't help.  I recommend contacting rheumatology again to ensure they don't want further follow up.

## 2015-05-03 ENCOUNTER — Telehealth: Payer: Self-pay | Admitting: Cardiovascular Disease

## 2015-05-03 NOTE — Telephone Encounter (Signed)
Received records from [facility] for appointment on  05/19/15 with Dr Oval Linsey.  Records given to [facility] (medical records) for Dr Blenda Mounts schedule on 05/19/15. lp

## 2015-05-19 ENCOUNTER — Ambulatory Visit (INDEPENDENT_AMBULATORY_CARE_PROVIDER_SITE_OTHER): Payer: BLUE CROSS/BLUE SHIELD

## 2015-05-19 ENCOUNTER — Encounter: Payer: Self-pay | Admitting: Cardiovascular Disease

## 2015-05-19 ENCOUNTER — Ambulatory Visit (INDEPENDENT_AMBULATORY_CARE_PROVIDER_SITE_OTHER): Payer: BLUE CROSS/BLUE SHIELD | Admitting: Cardiovascular Disease

## 2015-05-19 VITALS — BP 118/72 | HR 70 | Ht 62.0 in | Wt 184.7 lb

## 2015-05-19 DIAGNOSIS — R0602 Shortness of breath: Secondary | ICD-10-CM

## 2015-05-19 DIAGNOSIS — R072 Precordial pain: Secondary | ICD-10-CM

## 2015-05-19 DIAGNOSIS — E0590 Thyrotoxicosis, unspecified without thyrotoxic crisis or storm: Secondary | ICD-10-CM | POA: Diagnosis not present

## 2015-05-19 DIAGNOSIS — R002 Palpitations: Secondary | ICD-10-CM

## 2015-05-19 NOTE — Patient Instructions (Addendum)
Your physician has requested that you have an echocardiogram @ [address]. Echocardiography is a painless test that uses sound waves to create images of your heart. It provides your doctor with information about the size and shape of your heart and how well your heart's chambers and valves are working. This procedure takes approximately one hour. There are no restrictions for this procedure.  Your Doctor has ordered you to wear a heart monitor. You will wear this for 7 days.   Your physician wants you to follow-up in: 6 months with Dr. Oval Linsey.  You will receive a reminder letter in the mail two months in advance. If you don't receive a letter, please call our office to schedule the follow-up appointment.  Dr. Oval Linsey has referred you to Dr. Chalmers Cater (endocrinolgist)  TIPS -  REMINDERS for monitor.  1. The sensor is the lanyard that is worn around your neck every day - this is powered by a battery that needs to be changed every day 2. The monitor is the device that allows you to record symptoms - this will need to be charged daily 3. The sensor & monitor need to be within 100 feet of each other at all times 4. The sensor connects to the electrodes (stickers) - these should be changed every 24-48 hours (you do not have to remove them when you bathe, just make sure they are dry when you connect it back to the sensor 5. If you need more supplies (electrodes, batteries), please call the 1-800 # on the back of the pamphlet and [employer] will mail you more supplies 6. If your skin becomes sensitive, please try the sample pack of sensitive skin electrodes (the white packet in your silver box) and call [employer] to have them mail you more of these type of electrodes 7. When you are finish wearing the monitor, please place all supplies back in the silver box, place the silver box in the pre-packaged UPS bag and drop off at [employer] or call them so they can come pick it up   Cardiac Event  Monitoring A cardiac event monitor is a small recording device used to help detect abnormal heart rhythms (arrhythmias). The monitor is used to record heart rhythm when noticeable symptoms such as the following occur:  Fast heartbeats (palpitations), such as heart racing or fluttering.  Dizziness.  Fainting or light-headedness.  Unexplained weakness. The monitor is wired to two electrodes placed on your chest. Electrodes are flat, sticky disks that attach to your skin. The monitor can be worn for up to 30 days. You will wear the monitor at all times, except when bathing.  HOW TO USE YOUR CARDIAC EVENT MONITOR A technician will prepare your chest for the electrode placement. The technician will show you how to place the electrodes, how to work the monitor, and how to replace the batteries. Take time to practice using the monitor before you leave the office. Make sure you understand how to send the information from the monitor to your health care provider. This requires a telephone with a landline, not a cell phone. You need to:  Wear your monitor at all times, except when you are in water:  Do not get the monitor wet.  Take the monitor off when bathing. Do not swim or use a hot tub with it on.  Keep your skin clean. Do not put body lotion or moisturizer on your chest.  Change the electrodes daily or any time they stop  sticking to your skin. You might need to use tape to keep them on.  It is possible that your skin under the electrodes could become irritated. To keep this from happening, try to put the electrodes in slightly different places on your chest. However, they must remain in the area under your left breast and in the upper right section of your chest.  Make sure the monitor is safely clipped to your clothing or in a location close to your body that your health care provider recommends.  Press the button to record when you feel symptoms of heart trouble, such as dizziness,  weakness, light-headedness, palpitations, thumping, shortness of breath, unexplained weakness, or a fluttering or racing heart. The monitor is always on and records what happened slightly before you pressed the button, so do not worry about being too late to get good information.  Keep a diary of your activities, such as walking, doing chores, and taking medicine. It is especially important to note what you were doing when you pushed the button to record your symptoms. This will help your health care provider determine what might be contributing to your symptoms. The information stored in your monitor will be reviewed by your health care provider alongside your diary entries.  Send the recorded information as recommended by your health care provider. It is important to understand that it will take some time for your health care provider to process the results.  Change the batteries as recommended by your health care provider. SEEK IMMEDIATE MEDICAL CARE IF:   You have chest pain.  You have extreme difficulty breathing or shortness of breath.  You develop a very fast heartbeat that persists.  You develop dizziness that does not go away.  You faint or constantly feel you are about to faint. Document Released: 03/26/2008 Document Revised: 11/01/2013 Document Reviewed: 12/14/2012 [facility] Patient Information 2015 [place], [place]. This information is not intended to replace advice given to you by your health care provider. Make sure you discuss any questions you have with your health care provider.

## 2015-05-19 NOTE — Progress Notes (Signed)
Cardiology Office Note   Date:  05/20/2015   ID:  Angel Bennett, DOB Jan 27, 1966, MRN [id number]  PCP:  No Pcp Per Pt  Cardiologist:   Sharol Harness, MD   Chief Complaint  Patient presents with  . New Evaluation  . Chest Pain    pt states she feels tightness in her chest, also feels like she has something heavy on her chest  . Edema    no edema  . Shortness of Breath    especially at night, when laying on her right side she feels like she can't breathe      History of Present Illness: Angel Bennett is a 49 y.o. female with GERD who presents for an evaluation of chest pain.  Angel Bennett saw Dr. Harlene Ramus on 04/26/15 with a complaint of chest tightness.  Her EKG showed sinus rhythm with no ST/T changes. She received a prescription for nitroglycerin and was scheduled to follow up with cardiology.  Angel Bennett has noted episodes of palpitations that are associated with diaphoresis.  It occurs both at rest and with exertion.  Last week her heart was racing for the entire week.  She has not had any heart racing in the last 3 days.  She endorses lightheadedness and dizziness when she has the palpitations.  She endorses near syncope and nausea.  Angel Bennett also has a hard time breathing when she lays in bed at night.  She endorses PND and has been sleeping on one pillow.  She endorses occasional LE edema that improves with elevation of her legs.  She has noted 7/10 chest pressure that has been constant for the last 2 months.  It occurs both at rest and with exertion and is associated with shortness of breath. Angel Bennett was diagnosed with hyperthyroidism and has a referral to see Endocrinology but her follow up isn't until March.  She has noted swelling in her neck, a  20 lb weight gain, hair loss and constipation.  Angel Bennett does not get any exercise.  She works as an Government social research officer.  By the time she gets home she is very fatigued.  She has no family history of cardiac  disease and does not smoke.  Past Medical History  Diagnosis Date  . Abdominal pain     Diffuse mild  . C. difficile colitis [id number]    Treated w PO Metronidazole  . Constipation, chronic   . GERD (gastroesophageal reflux disease)   . 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 Hx of adenomatous polyp of colon 10/27/2014  . Hyperthyroidism 05/20/2015  . Palpitations 05/20/2015  . Chest pain 05/20/2015    Past Surgical History  Procedure Laterality Date  . Hemorrhoid surgery  1990  . Cesarean section  1990  . Cholecystectomy  03/13/2011    lap single site  . Esophagogastroduodenoscopy  05/15/2011    Procedure: ESOPHAGOGASTRODUODENOSCOPY (EGD);  Surgeon: Landry Dyke, MD;  Location: [facility];  Service: Endoscopy;  Laterality: N/A;  . Colonoscopy  05/15/2011    Procedure: COLONOSCOPY;  Surgeon: Landry Dyke, MD;  Location: [facility];  Service: Endoscopy;  Laterality: N/A;     Current Outpatient Prescriptions  Medication Sig Dispense Refill  . hydrOXYzine (ATARAX/VISTARIL) 25 MG tablet Take 0.5-1 tablets (12.5-25 mg total) by mouth every 8 (eight) hours as needed for itching. (Patient not taking: Reported on 05/19/2015) 30 tablet 0  . lansoprazole (PREVACID) 30 MG capsule Take 1  capsule (30 mg total) by mouth daily. (Patient not taking: Reported on 03/19/2015) 30 capsule 5  . permethrin (ELIMITE) 5 % cream Apply 1 application topically once. (Patient not taking: Reported on 05/19/2015) 60 g 0  . triamcinolone cream (KENALOG) 0.1 % Apply 1 application topically 2 (two) times daily. (Patient not taking: Reported on 05/19/2015) 30 g 0   No current facility-administered medications for this visit.    Allergies:   Review of patient's allergies indicates no known allergies.    Social History:  The patient  reports that she has never smoked. She has never used smokeless tobacco. She reports that she does not drink alcohol or use illicit drugs.   Family History:  The patient's  family history includes Diabetes (age of onset: 51) in her mother.    ROS:  Please see the history of present illness.   Otherwise, review of systems are positive for none.   All other systems are reviewed and negative.    PHYSICAL EXAM: VS:  BP 118/72 mmHg  Pulse 70  Ht 5\' 2"  (1.575 m)  Wt 83.779 kg (184 lb 11.2 oz)  BMI 33.77 kg/m2 , BMI Body mass index is 33.77 kg/(m^2). GENERAL:  Well appearing HEENT:  Pupils equal round and reactive, fundi not visualized, oral mucosa unremarkable NECK:  No jugular venous distention, waveform within normal limits, carotid upstroke brisk and symmetric, no bruits, increased fullness in the L lobe of the thyroid LYMPHATICS:  No cervical adenopathy LUNGS:  Clear to auscultation bilaterally HEART:  RRR.  PMI not displaced or sustained,S1 and S2 within normal limits, no S3, no S4, no clicks, no rubs, no murmurs ABD:  Flat, positive bowel sounds normal in frequency in pitch, no bruits, no rebound, no guarding, no midline pulsatile mass, no hepatomegaly, no splenomegaly EXT:  2 plus pulses throughout, no edema, no cyanosis no clubbing SKIN:  No rashes no nodules NEURO:  Cranial nerves II through XII grossly intact, motor grossly intact throughout PSYCH:  Cognitively intact, oriented to person place and time    EKG:  EKG is ordered today. The ekg ordered today demonstrates sinus rhythm rate 70 bpm.  Incomplete RBBB.   Recent Labs: 10/10/2014: TSH 0.331* 12/15/2014: ALT 9; BUN 7; Creat 0.66; Hemoglobin 12.3; Potassium 3.8; Sodium 138    Lipid Panel No results found for: CHOL, TRIG, HDL, CHOLHDL, VLDL, LDLCALC, LDLDIRECT    Wt Readings from Last 3 Encounters:  05/19/15 83.779 kg (184 lb 11.2 oz)  03/19/15 81.92 kg (180 lb 9.6 oz)  03/12/15 81.647 kg (180 lb)      ASSESSMENT AND PLAN:  # Chest pain: Angel Bennett chest pain is atypical and she does not have any risk factors for coronary artery disease.  Her pain is constant and not consistent with  ischemia. I suspect this may be related to her untreated hyper thyroidism. Her TSH has been low for several years. She is currently on a waiting evaluation by an endocrinologist but cannot be seen until March. We will refer her to endocrine in addition to her PCPs referral so that hopefully she can be seen sooner. We will obtain an echo to ensure there is no structural heart disease or heart failure.  # Palpitations: EKG is normal today. I wonder if she is having episodes of atrial fibrillation in the setting of untreated hyperthyroidism. We will obtain a 7 day event monitor as she reports having episodes of palpitations at least one to 2 times per week. We have referred  her to endocrine the above.   Current medicines are reviewed at length with the patient today.  The patient does not have concerns regarding medicines.  The following changes have been made:  no change  Labs/ tests ordered today include:   Orders Placed This Encounter  Procedures  . Ambulatory referral to Endocrinology  . Cardiac event monitor  . EKG 12-Lead  . ECHOCARDIOGRAM COMPLETE     Disposition:   FU with Lynk Marti C. Oval Linsey, MD in 6 months.     Signed, Sharol Harness, MD  05/20/2015 2:22 AM    [place]

## 2015-05-20 ENCOUNTER — Encounter: Payer: Self-pay | Admitting: Cardiovascular Disease

## 2015-05-20 DIAGNOSIS — R002 Palpitations: Secondary | ICD-10-CM

## 2015-05-20 DIAGNOSIS — R079 Chest pain, unspecified: Secondary | ICD-10-CM | POA: Insufficient documentation

## 2015-05-20 DIAGNOSIS — E0590 Thyrotoxicosis, unspecified without thyrotoxic crisis or storm: Secondary | ICD-10-CM

## 2015-05-20 HISTORY — DX: Thyrotoxicosis, unspecified without thyrotoxic crisis or storm: E05.90

## 2015-05-20 HISTORY — DX: Chest pain, unspecified: R07.9

## 2015-05-20 HISTORY — DX: Palpitations: R00.2

## 2015-05-24 ENCOUNTER — Telehealth: Payer: Self-pay | Admitting: Cardiovascular Disease

## 2015-05-24 NOTE — Telephone Encounter (Signed)
Pt called in stating that she was put on a 7 day monitor on 11/18 but she took it off on 11/20 because it was bothering her during work. She would like to know if she can bring the monitor back to the office or should see mail it in. Please f/u with her  Thanks

## 2015-05-24 NOTE — Telephone Encounter (Signed)
Spoke to patient Informed her to place monitor in enclosed UPS envelope - mail back to [place] She verbalized understanding.

## 2015-05-31 ENCOUNTER — Ambulatory Visit (HOSPITAL_COMMUNITY): Payer: BLUE CROSS/BLUE SHIELD | Attending: Cardiovascular Disease

## 2015-05-31 ENCOUNTER — Other Ambulatory Visit: Payer: Self-pay

## 2015-05-31 DIAGNOSIS — R002 Palpitations: Secondary | ICD-10-CM | POA: Insufficient documentation

## 2015-05-31 DIAGNOSIS — R0602 Shortness of breath: Secondary | ICD-10-CM | POA: Diagnosis not present

## 2015-05-31 DIAGNOSIS — R0600 Dyspnea, unspecified: Secondary | ICD-10-CM | POA: Diagnosis present

## 2015-05-31 DIAGNOSIS — I071 Rheumatic tricuspid insufficiency: Secondary | ICD-10-CM | POA: Diagnosis not present

## 2015-06-05 DIAGNOSIS — R002 Palpitations: Secondary | ICD-10-CM

## 2015-06-08 ENCOUNTER — Telehealth: Payer: Self-pay | Admitting: *Deleted

## 2015-06-08 NOTE — Telephone Encounter (Signed)
-----   Message from Skeet Latch, MD sent at 06/05/2015  2:01 PM EST ----- Normal study.  Her heart rate was a little fast, but a normal rhythm when she noted symptoms.  This is likely due to her thyroid disease.  Keep follow up with Endocrine and I will see her as planned after that.

## 2015-06-08 NOTE — Telephone Encounter (Signed)
Spoke to patient. Result given . Verbalized understanding  

## 2015-06-08 NOTE — Telephone Encounter (Signed)
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  This does not explain her shortness of breath but will need to be monitored over time.

## 2015-06-12 ENCOUNTER — Telehealth: Payer: Self-pay | Admitting: Cardiology

## 2015-06-12 NOTE — Telephone Encounter (Signed)
Niece needed clarification on results that were discussed.

## 2015-06-12 NOTE — Telephone Encounter (Signed)
Pt's niece called in wanting to speak with a nurse about her recent results to a test. The pt was not very clear on what the nurse said. Please f/u  Thanks

## 2015-06-14 ENCOUNTER — Telehealth: Payer: Self-pay | Admitting: Cardiology

## 2015-06-14 NOTE — Telephone Encounter (Signed)
I called Dr. Nino Glow office inquiring about appt with Dr. Soyla Murphy.  The patient had an appointment on 05-23-15 and cancelled.  She then made an appointment for 06-15-15 and cancelled this also.

## 2015-06-14 NOTE — Telephone Encounter (Signed)
Results readdressed w/ patient, who voiced understanding.

## 2015-06-14 NOTE — Telephone Encounter (Signed)
Saher is calling to clarification on results , because her mother was not clear on what was said to her . Please call

## 2015-11-22 ENCOUNTER — Ambulatory Visit (INDEPENDENT_AMBULATORY_CARE_PROVIDER_SITE_OTHER): Payer: BLUE CROSS/BLUE SHIELD | Admitting: Family Medicine

## 2015-11-22 VITALS — BP 120/80 | HR 68 | Temp 98.2°F | Resp 16 | Ht 62.0 in | Wt 188.0 lb

## 2015-11-22 DIAGNOSIS — R590 Localized enlarged lymph nodes: Secondary | ICD-10-CM

## 2015-11-22 DIAGNOSIS — J0101 Acute recurrent maxillary sinusitis: Secondary | ICD-10-CM | POA: Diagnosis not present

## 2015-11-22 DIAGNOSIS — E0590 Thyrotoxicosis, unspecified without thyrotoxic crisis or storm: Secondary | ICD-10-CM | POA: Diagnosis not present

## 2015-11-22 LAB — POCT CBC
GRANULOCYTE PERCENT: 47.5 % (ref 37–80)
HCT, POC: 38.4 % (ref 37.7–47.9)
Hemoglobin: 13.3 g/dL (ref 12.2–16.2)
Lymph, poc: 2.4 (ref 0.6–3.4)
MCH: 30.3 pg (ref 27–31.2)
MCHC: 34.8 g/dL (ref 31.8–35.4)
MCV: 87.2 fL (ref 80–97)
MID (cbc): 0.5 (ref 0–0.9)
MPV: 7.6 fL (ref 0–99.8)
PLATELET COUNT, POC: 217 10*3/uL (ref 142–424)
POC Granulocyte: 2.6 (ref 2–6.9)
POC LYMPH PERCENT: 43.5 %L (ref 10–50)
POC MID %: 9 %M (ref 0–12)
RBC: 4.4 M/uL (ref 4.04–5.48)
RDW, POC: 12.2 %
WBC: 5.5 10*3/uL (ref 4.6–10.2)

## 2015-11-22 MED ORDER — AZELASTINE HCL 0.1 % NA SOLN: 2.0000 | Freq: Two times a day (BID) | NASAL | Status: DC

## 2015-11-22 MED ORDER — AMOXICILLIN-POT CLAVULANATE 875-125 MG PO TABS: 1.0000 | ORAL_TABLET | Freq: Two times a day (BID) | ORAL | Status: DC

## 2015-11-22 MED ORDER — FLUTICASONE PROPIONATE 50 MCG/ACT NA SUSP: 2.0000 | Freq: Every day | NASAL | Status: DC

## 2015-11-22 NOTE — Progress Notes (Signed)
Subjective:    Patient ID: Angel Bennett, female    DOB: 1966/05/05, 50 y.o.   MRN: [id number]  11/22/2015  Ear Pain and Sinusitis   HPI This 50 y.o. female presents for evaluation of R ear pain, sinus congestion.  Onset two weeks ago with swelling around R ear; pain now for two days.  No fever but +chills.  +HA R ear pain; B sinus congestion chronic; R sided nasal congestion.  +nasal spray OTC every morning; Afrin; for two months.  +rhinorrhea; white drainage.  +PND.  No sneezing.  +coughing nighttime dry.  No other medication.  No v/d.  Normal hearing; feels congested in ear.  Like water in ear.    Requesting thyroid labs; history of hyperthyroidism yet did not return for follow-up.  Suffering with fatigue, depression intermittently.   Review of Systems  Constitutional: Positive for chills and fatigue. Negative for fever and diaphoresis.  HENT: Positive for congestion, ear pain, facial swelling, postnasal drip, rhinorrhea and sinus pressure. Negative for ear discharge, hearing loss, sneezing, sore throat, tinnitus, trouble swallowing and voice change.   Eyes: Negative for visual disturbance.  Respiratory: Positive for cough. Negative for shortness of breath.   Cardiovascular: Negative for chest pain, palpitations and leg swelling.  Gastrointestinal: Negative for nausea, vomiting, abdominal pain, diarrhea and constipation.  Endocrine: Negative for cold intolerance, heat intolerance, polydipsia, polyphagia and polyuria.  Neurological: Negative for dizziness, tremors, seizures, syncope, facial asymmetry, speech difficulty, weakness, light-headedness, numbness and headaches.    Past Medical History  Diagnosis Date  . Abdominal pain     Diffuse mild  . C. difficile colitis [id number]    Treated w PO Metronidazole  . Constipation, chronic   . GERD (gastroesophageal reflux disease)   . 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 Hx of adenomatous polyp of colon 10/27/2014  . Hyperthyroidism 05/20/2015   . Palpitations 05/20/2015  . Chest pain 05/20/2015   Past Surgical History  Procedure Laterality Date  . Hemorrhoid surgery  1990  . Cesarean section  1990  . Cholecystectomy  03/13/2011    lap single site  . Esophagogastroduodenoscopy  05/15/2011    Procedure: ESOPHAGOGASTRODUODENOSCOPY (EGD);  Surgeon: Landry Dyke, MD;  Location: [facility];  Service: Endoscopy;  Laterality: N/A;  . Colonoscopy  05/15/2011    Procedure: COLONOSCOPY;  Surgeon: Landry Dyke, MD;  Location: [facility];  Service: Endoscopy;  Laterality: N/A;   No Known Allergies Current Outpatient Prescriptions  Medication Sig Dispense Refill  . amoxicillin-clavulanate (AUGMENTIN) 875-125 MG tablet Take 1 tablet by mouth 2 (two) times daily. 20 tablet 0  . azelastine (ASTELIN) 0.1 % nasal spray Place 2 sprays into both nostrils 2 (two) times daily. Use in each nostril as directed 30 mL 12  . fluticasone (FLONASE) 50 MCG/ACT nasal spray Place 2 sprays into both nostrils daily. 16 g 6  . hydrOXYzine (ATARAX/VISTARIL) 25 MG tablet Take 0.5-1 tablets (12.5-25 mg total) by mouth every 8 (eight) hours as needed for itching. (Patient not taking: Reported on 05/19/2015) 30 tablet 0  . lansoprazole (PREVACID) 30 MG capsule Take 1 capsule (30 mg total) by mouth daily. (Patient not taking: Reported on 03/19/2015) 30 capsule 5  . permethrin (ELIMITE) 5 % cream Apply 1 application topically once. (Patient not taking: Reported on 05/19/2015) 60 g 0  . triamcinolone cream (KENALOG) 0.1 % Apply 1 application topically 2 (two) times daily. (Patient not taking: Reported on 05/19/2015) 30 g 0  No current facility-administered medications for this visit.   Social History   Social History  . Marital Status: Married    Spouse Name: N/A  . Number of Children: N/A  . Years of Education: N/A   Occupational History  . Not on file.   Social History Main Topics  . Smoking status: Never Smoker   . Smokeless tobacco: Never Used   . Alcohol Use: No  . Drug Use: No  . Sexual Activity: Yes    Birth Control/ Protection: None   Other Topics Concern  . Not on file   Social History Narrative   She is married she has 4 sons and one daughter. 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 Updated 10/26/2014         Epworth Sleepiness Scale = 9 (as of 05/19/15)   Family History  Problem Relation Age of Onset  . Diabetes Mother 5       Objective:    BP 120/80 mmHg  Pulse 68  Temp(Src) 98.2 F (36.8 C) (Oral)  Resp 16  Ht 5\' 2"  (1.575 m)  Wt 188 lb (85.276 kg)  BMI 34.38 kg/m2  SpO2 98% Physical Exam  Constitutional: She is oriented to person, place, and time. She appears well-developed and well-nourished. No distress.  HENT:  Head: Normocephalic and atraumatic.  Right Ear: External ear normal. Tympanic membrane is retracted. Tympanic membrane is not bulging.  Left Ear: External ear normal. Tympanic membrane is not retracted and not bulging.  Nose: Mucosal edema and rhinorrhea present. Right sinus exhibits maxillary sinus tenderness. Right sinus exhibits no frontal sinus tenderness. Left sinus exhibits no maxillary sinus tenderness and no frontal sinus tenderness.  Mouth/Throat: Oropharynx is clear and moist. No oropharyngeal exudate.  Eyes: Conjunctivae are normal. Pupils are equal, round, and reactive to light.  Neck: Normal range of motion. Neck supple. No JVD present. No tracheal deviation present. No thyromegaly present.  Cardiovascular: Normal rate, regular rhythm and normal heart sounds.  Exam reveals no gallop and no friction rub.   No murmur heard. Pulmonary/Chest: Effort normal and breath sounds normal. She has no wheezes. She has no rales.  Lymphadenopathy:    She has cervical adenopathy.  Neurological: She is alert and oriented to person, place, and time.  Skin: No rash noted. She is not diaphoretic.  Psychiatric: She has a normal mood and affect. Her behavior is normal.   Nursing note and vitals reviewed.  Results for orders placed or performed in visit on 11/22/15  POCT CBC  Result Value Ref Range   WBC 5.5 4.6 - 10.2 K/uL   Lymph, poc 2.4 0.6 - 3.4   POC LYMPH PERCENT 43.5 10 - 50 %L   MID (cbc) 0.5 0 - 0.9   POC MID % 9.0 0 - 12 %M   POC Granulocyte 2.6 2 - 6.9   Granulocyte percent 47.5 37 - 80 %G   RBC 4.40 4.04 - 5.48 M/uL   Hemoglobin 13.3 12.2 - 16.2 g/dL   HCT, POC 38.4 37.7 - 47.9 %   MCV 87.2 80 - 97 fL   MCH, POC 30.3 27 - 31.2 pg   MCHC 34.8 31.8 - 35.4 g/dL   RDW, POC 12.2 %   Platelet Count, POC 217 142 - 424 K/uL   MPV 7.6 0 - 99.8 fL       Assessment & Plan:   1. Hyperthyroidism   2. LAD (lymphadenopathy), cervical   3. Acute recurrent  maxillary sinusitis    -uncontrolled hyperthyroidism; repeat labs. -new sinusitis with R anterior cervical LAD; rx for Augmentin, Astelin, Flonase provided.   Orders Placed This Encounter  Procedures  . TSH  . T4, free  . POCT CBC   Meds ordered this encounter  Medications  . amoxicillin-clavulanate (AUGMENTIN) 875-125 MG tablet    Sig: Take 1 tablet by mouth 2 (two) times daily.    Dispense:  20 tablet    Refill:  0  . fluticasone (FLONASE) 50 MCG/ACT nasal spray    Sig: Place 2 sprays into both nostrils daily.    Dispense:  16 g    Refill:  6  . azelastine (ASTELIN) 0.1 % nasal spray    Sig: Place 2 sprays into both nostrils 2 (two) times daily. Use in each nostril as directed    Dispense:  30 mL    Refill:  12    No Follow-up on file.    Jorian [place] Elayne Guerin, M.D. [facility] [address] [place], [place]  [address] [phone] phone [phone] fax

## 2015-11-22 NOTE — Patient Instructions (Addendum)
   IF you received an x-ray today, you will receive an invoice from [facility]. Please contact [facility] at [phone] with questions or concerns regarding your invoice.   IF you received labwork today, you will receive an invoice from [facility]. Please contact [facility] at [phone] with questions or concerns regarding your invoice.   Our billing staff will not be able to assist you with questions regarding bills from these companies.  You will be contacted with the lab results as soon as they are available. The fastest way to get your results is to activate your My Chart account. Instructions are located on the last page of this paperwork. If you have not heard from us regarding the results in 2 weeks, please contact this office.     Sinusitis, Adult Sinusitis is redness, soreness, and inflammation of the paranasal sinuses. Paranasal sinuses are air pockets within the bones of your face. They are located beneath your eyes, in the middle of your forehead, and above your eyes. In healthy paranasal sinuses, mucus is able to drain out, and air is able to circulate through them by way of your nose. However, when your paranasal sinuses are inflamed, mucus and air can become trapped. This can allow bacteria and other germs to grow and cause infection. Sinusitis can develop quickly and last only a short time (acute) or continue over a long period (chronic). Sinusitis that lasts for more than 12 weeks is considered chronic. CAUSES Causes of sinusitis include:  Allergies.  Structural abnormalities, such as displacement of the cartilage that separates your nostrils (deviated septum), which can decrease the air flow through your nose and sinuses and affect sinus drainage.  Functional abnormalities, such as when the small hairs (cilia) that line your sinuses and help remove mucus do not work properly or are not present. SIGNS AND SYMPTOMS Symptoms  of acute and chronic sinusitis are the same. The primary symptoms are pain and pressure around the affected sinuses. Other symptoms include:  Upper toothache.  Earache.  Headache.  Bad breath.  Decreased sense of smell and taste.  A cough, which worsens when you are lying flat.  Fatigue.  Fever.  Thick drainage from your nose, which often is green and may contain pus (purulent).  Swelling and warmth over the affected sinuses. DIAGNOSIS Your health care provider will perform a physical exam. During your exam, your health care provider may perform any of the following to help determine if you have acute sinusitis or chronic sinusitis:  Look in your nose for signs of abnormal growths in your nostrils (nasal polyps).  Tap over the affected sinus to check for signs of infection.  View the inside of your sinuses using an imaging device that has a light attached (endoscope). If your health care provider suspects that you have chronic sinusitis, one or more of the following tests may be recommended:  Allergy tests.  Nasal culture. A sample of mucus is taken from your nose, sent to a lab, and screened for bacteria.  Nasal cytology. A sample of mucus is taken from your nose and examined by your health care provider to determine if your sinusitis is related to an allergy. TREATMENT Most cases of acute sinusitis are related to a viral infection and will resolve on their own within 10 days. Sometimes, medicines are prescribed to help relieve symptoms of both acute and chronic sinusitis. These may include pain medicines, decongestants, nasal steroid sprays, or saline sprays. However, for sinusitis related   to a bacterial infection, your health care provider will prescribe antibiotic medicines. These are medicines that will help kill the bacteria causing the infection. Rarely, sinusitis is caused by a fungal infection. In these cases, your health care provider will prescribe antifungal  medicine. For some cases of chronic sinusitis, surgery is needed. Generally, these are cases in which sinusitis recurs more than 3 times per year, despite other treatments. HOME CARE INSTRUCTIONS  Drink plenty of water. Water helps thin the mucus so your sinuses can drain more easily.  Use a humidifier.  Inhale steam 3-4 times a day (for example, sit in the bathroom with the shower running).  Apply a warm, moist washcloth to your face 3-4 times a day, or as directed by your health care provider.  Use saline nasal sprays to help moisten and clean your sinuses.  Take medicines only as directed by your health care provider.  If you were prescribed either an antibiotic or antifungal medicine, finish it all even if you start to feel better. SEEK IMMEDIATE MEDICAL CARE IF:  You have increasing pain or severe headaches.  You have nausea, vomiting, or drowsiness.  You have swelling around your face.  You have vision problems.  You have a stiff neck.  You have difficulty breathing.   This information is not intended to replace advice given to you by your health care provider. Make sure you discuss any questions you have with your health care provider.   Document Released: 06/17/2005 Document Revised: 07/08/2014 Document Reviewed: 07/02/2011 Elsevier Interactive Patient Education 2016 [employer] Inc[employer]  

## 2015-11-23 LAB — TSH: TSH: 0.22 m[IU]/L — AB

## 2015-11-23 LAB — T4, FREE: FREE T4: 1.1 ng/dL (ref 0.8–1.8)

## 2015-11-30 ENCOUNTER — Telehealth: Payer: Self-pay | Admitting: Cardiovascular Disease

## 2015-12-06 NOTE — Telephone Encounter (Signed)
Closed encounter °

## 2015-12-17 ENCOUNTER — Encounter: Payer: Self-pay | Admitting: Family Medicine

## 2016-01-14 IMAGING — CR DG KNEE 1-2V*R*
2 series · 2 of 2 positions shown · non-contrast
Comparison: None.

CLINICAL DATA: Right knee pain.

EXAM:
RIGHT KNEE - 1-2 VIEW

[AP]
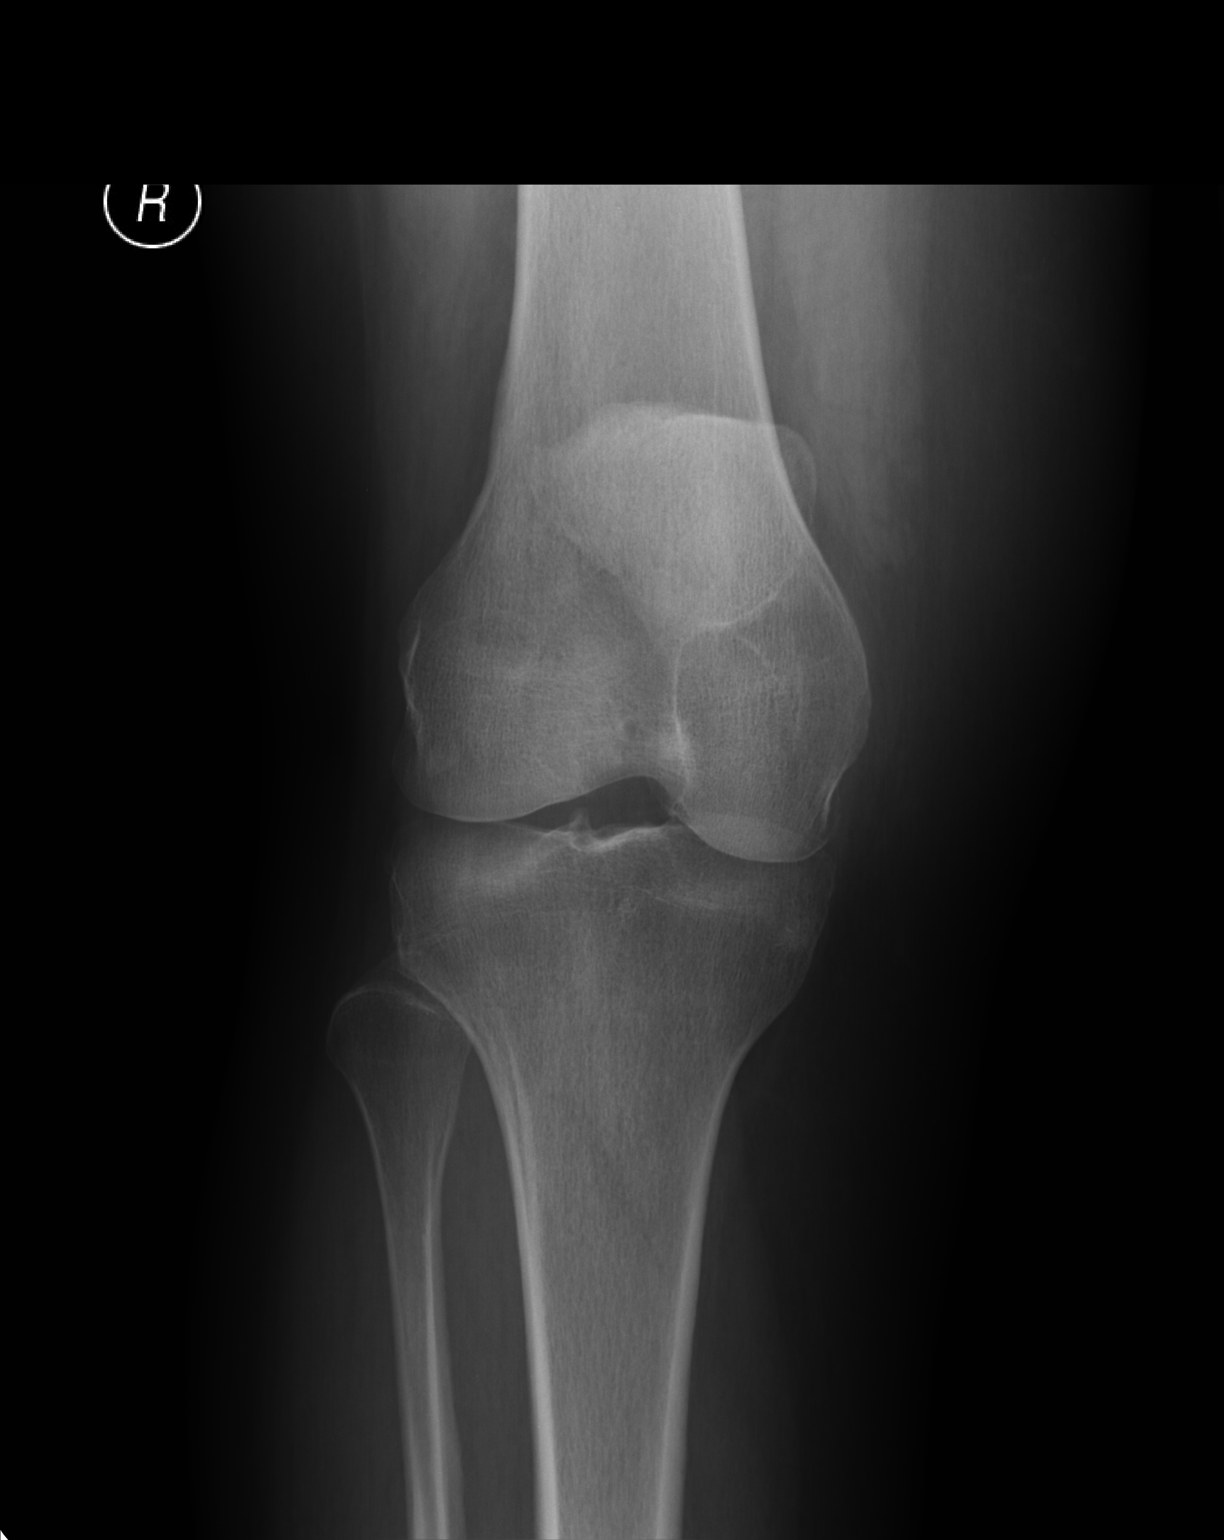

[lateral]
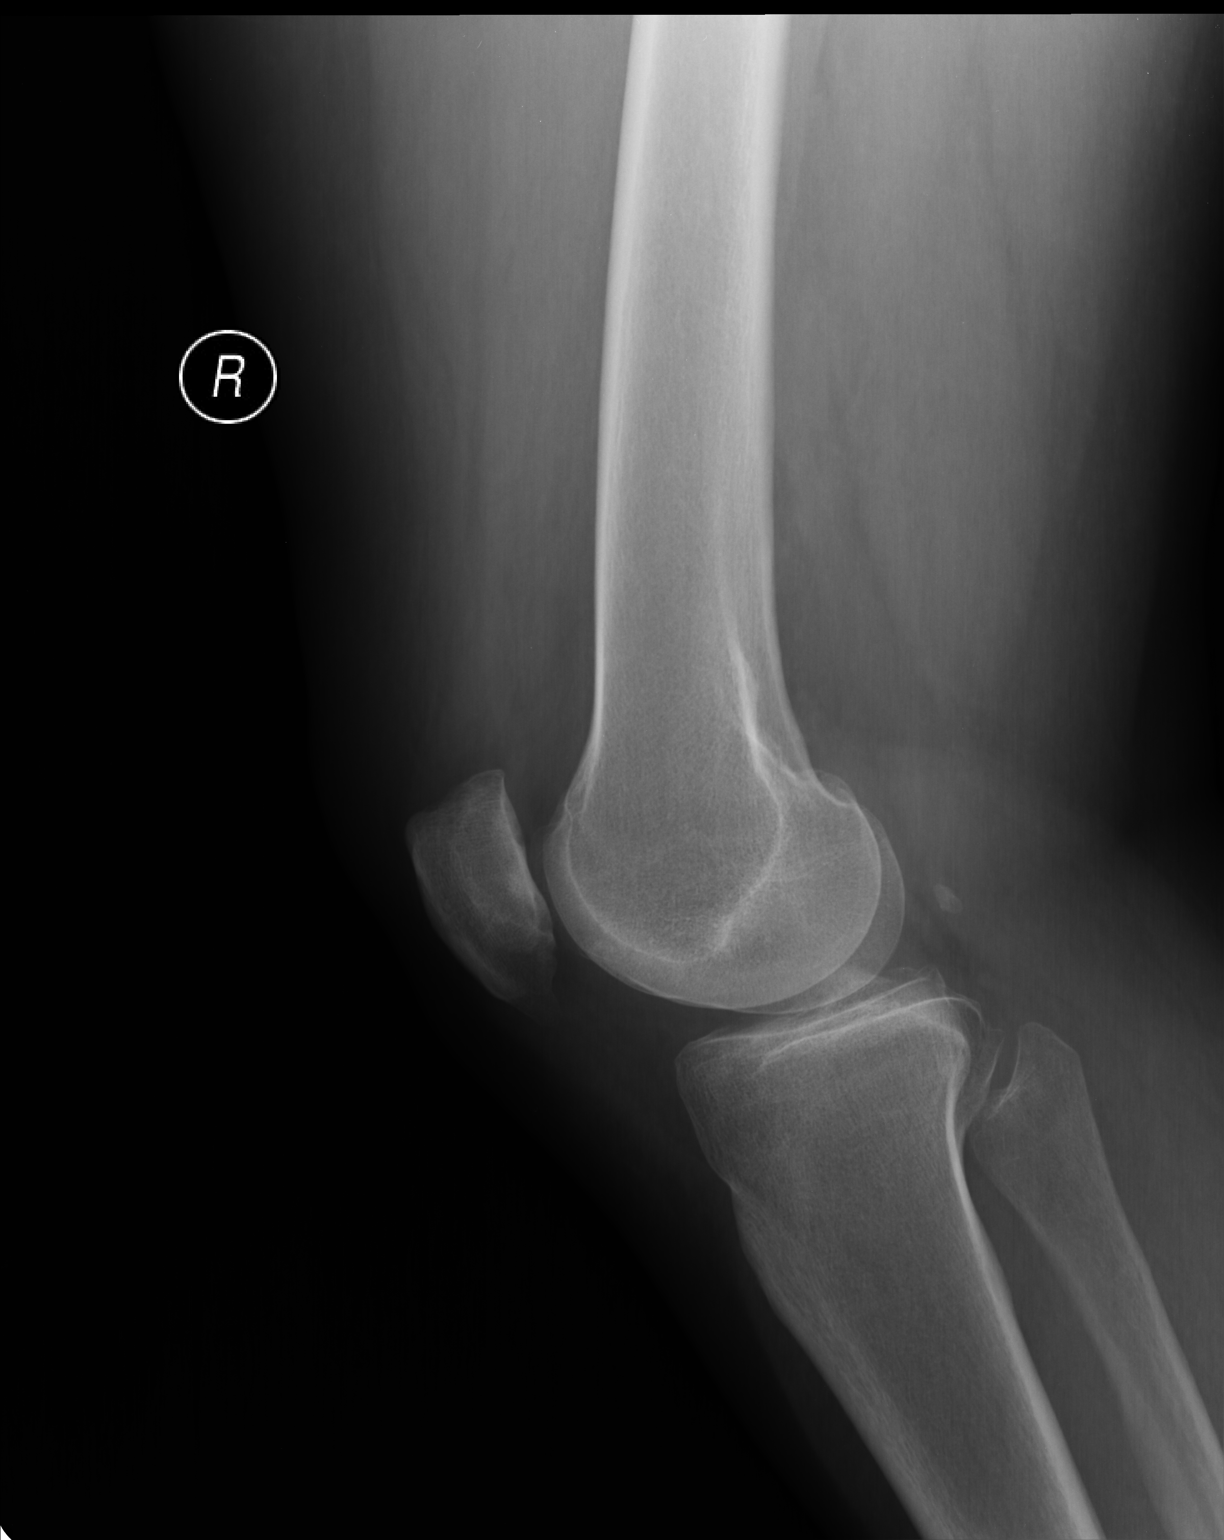

[2 of 2 positions shown; findings below may reference images not displayed]

FINDINGS: The joint spaces are maintained. Mild degenerative changes but no
acute bony findings. Small joint effusion.
IMPRESSION: Mild degenerative changes but no acute bony findings.

Small joint effusion.

## 2016-02-12 ENCOUNTER — Ambulatory Visit (INDEPENDENT_AMBULATORY_CARE_PROVIDER_SITE_OTHER): Payer: BLUE CROSS/BLUE SHIELD | Admitting: Physician Assistant

## 2016-02-12 VITALS — BP 110/70 | HR 67 | Temp 97.8°F | Resp 18 | Ht 62.0 in | Wt 187.4 lb

## 2016-02-12 DIAGNOSIS — N898 Other specified noninflammatory disorders of vagina: Secondary | ICD-10-CM

## 2016-02-12 DIAGNOSIS — R1030 Lower abdominal pain, unspecified: Secondary | ICD-10-CM

## 2016-02-12 DIAGNOSIS — R7989 Other specified abnormal findings of blood chemistry: Secondary | ICD-10-CM

## 2016-02-12 DIAGNOSIS — H9201 Otalgia, right ear: Secondary | ICD-10-CM | POA: Diagnosis not present

## 2016-02-12 DIAGNOSIS — R300 Dysuria: Secondary | ICD-10-CM | POA: Diagnosis not present

## 2016-02-12 LAB — POCT URINALYSIS DIP (MANUAL ENTRY)
BILIRUBIN UA: NEGATIVE
BILIRUBIN UA: NEGATIVE
Blood, UA: NEGATIVE
GLUCOSE UA: NEGATIVE
LEUKOCYTES UA: NEGATIVE
Nitrite, UA: NEGATIVE
Protein Ur, POC: NEGATIVE
SPEC GRAV UA: 1.01
Urobilinogen, UA: 0.2
pH, UA: 6.5

## 2016-02-12 LAB — POCT WET + KOH PREP
TRICH BY WET PREP: ABSENT
YEAST BY KOH: ABSENT
YEAST BY WET PREP: ABSENT

## 2016-02-12 LAB — POC MICROSCOPIC URINALYSIS (UMFC): MUCUS RE: ABSENT

## 2016-02-12 LAB — POCT URINE PREGNANCY: PREG TEST UR: NEGATIVE

## 2016-02-12 NOTE — Progress Notes (Addendum)
Patient ID: Angel Bennett, female   DOB: 09-Dec-1965, 50 y.o.   MRN: [id number] [facility] [address], [place] [address] [phone]  By signing my name below I, [name], attest that this documentation has been prepared under the direction and in the presence of Ivar Drape PA. Electonically Signed. [name], Scribe 02/12/2016 at 6:34 PM   Date:  02/12/2016   Name:  Angel Bennett   DOB:  01-Oct-1965   MRN:  [id number]  PCP:  Aretta Nip, MD   Chief Complaint  Patient presents with   Urinary Tract Infection    burning sensation when urinating x2-3 days   Generalized Body Aches    x2-3 days     History of Present Illness:  [name] is a 50 y.o. female patient who presents to [facility] burning with urination for the past 2-3 days. Pt also c/o nausea, lower bilat abd pain, bilat flank pain, low bilat back pain, generalized body aches, and chills. Pt denies any vomiting, hematuria, or urinary frequency.   Pt's LMP was December 07, 2015. Pt states for the past 2 years her menstrual period has been occurring every other month and when it occurs has been heavier than prior to 2 years ago. Pt has been seen by gynecologist for the change in her periods and pt was told that she is perimenopausal. Pt states she does not think she is pregnant.    Patient Active Problem List   Diagnosis Date Noted   Hyperthyroidism 05/20/2015   Palpitations 05/20/2015   Chest pain 05/20/2015   Hx of adenomatous polyp of colon 10/27/2014   Chronic abdominal pain 04/28/2013   Partial small bowel obstruction ([place]) 0000000   History of Helicobacter pylori infection 04/28/2013   Chronic cholecystitis 03/06/2011   GERD (gastroesophageal reflux disease) 03/06/2011   Constipation, chronic 03/06/2011    Past Medical History:  Diagnosis Date   Abdominal pain    Diffuse mild   C. difficile colitis [id number]   Treated w PO Metronidazole   Chest pain  05/20/2015   Constipation, chronic    GERD (gastroesophageal reflux disease)    Hx of adenomatous polyp of colon 10/27/2014   Hyperthyroidism 05/20/2015   Palpitations 05/20/2015   Ringing in ears    both ears    Past Surgical History:  Procedure Laterality Date   [place]  03/13/2011   lap single site   COLONOSCOPY  05/15/2011   Procedure: COLONOSCOPY;  Surgeon: Landry Dyke, MD;  Location: [facility] ENDOSCOPY;  Service: Endoscopy;  Laterality: N/A;   ESOPHAGOGASTRODUODENOSCOPY  05/15/2011   Procedure: ESOPHAGOGASTRODUODENOSCOPY (EGD);  Surgeon: Landry Dyke, MD;  Location: [facility];  Service: Endoscopy;  Laterality: N/A;   HEMORRHOID SURGERY  1990    Social History  Substance Use Topics   Smoking status: Never Smoker   Smokeless tobacco: Never Used   Alcohol use No    Family History  Problem Relation Age of Onset   Diabetes Mother 11    No Known Allergies  Medication list has been reviewed and updated.  Current Outpatient Prescriptions on File Prior to Visit  Medication Sig Dispense Refill   azelastine (ASTELIN) 0.1 % nasal spray Place 2 sprays into both nostrils 2 (two) times daily. Use in each nostril as directed 30 mL 12   fluticasone (FLONASE) 50 MCG/ACT nasal spray Place 2 sprays into both nostrils daily. 16 g 6   No  current facility-administered medications on file prior to visit.     ROS ROS unremarkable unless otherwise specified.  Physical Examination: BP 110/70    Pulse 67    Temp 97.8 F (36.6 C) (Oral)    Resp 18    Ht 5\' 2"  (1.575 m)    Wt 187 lb 6.4 oz (85 kg)    SpO2 97%    BMI 34.28 kg/m  Ideal Body Weight: @FLOWAMB [id number] Wt Readings from Last 3 Encounters:  02/12/16 187 lb 6.4 oz (85 kg)  11/22/15 188 lb (85.3 kg)  05/19/15 184 lb 11.2 oz (83.8 kg)   Physical Exam  Constitutional: She is oriented to person, place, and time. She appears well-developed and well-nourished. No  distress.  HENT:  Head: Normocephalic and atraumatic.  Right Ear: External ear normal.  Left Ear: External ear normal.  Eyes: Conjunctivae and EOM are normal. Pupils are equal, round, and reactive to light.  Cardiovascular: Normal rate, regular rhythm and normal heart sounds.  Exam reveals no gallop and no friction rub.   No murmur heard. Pulmonary/Chest: Effort normal. No respiratory distress. She has no decreased breath sounds. She has no wheezes. She has no rhonchi. She has no rales.  Abdominal: Soft. Normal appearance and bowel sounds are normal. There is tenderness (diffuse). There is CVA tenderness (bilat, R>L).  Genitourinary: There is no rash or tenderness on the right labia. There is no rash or tenderness on the left labia. Cervix exhibits no discharge and no friability. Right adnexum displays tenderness. Right adnexum displays no mass. Left adnexum displays no mass. No erythema, tenderness or bleeding in the vagina.  Neurological: She is alert and oriented to person, place, and time.  Skin: She is not diaphoretic.  Psychiatric: She has a normal mood and affect. Her behavior is normal.   Results for orders placed or performed in visit on 02/12/16  POCT urinalysis dipstick  Result Value Ref Range   Color, UA yellow yellow   Clarity, UA clear clear   Glucose, UA negative negative   Bilirubin, UA negative negative   Ketones, POC UA negative negative   Spec Grav, UA 1.010    Blood, UA negative negative   pH, UA 6.5    Protein Ur, POC negative negative   Urobilinogen, UA 0.2    Nitrite, UA Negative Negative   Leukocytes, UA Negative Negative  POCT Microscopic Urinalysis ([facility])  Result Value Ref Range   WBC,UR,HPF,POC None None WBC/hpf   RBC,UR,HPF,POC None None RBC/hpf   Bacteria None None, Too numerous to count   Mucus Absent Absent   Epithelial Cells, UR Per Microscopy None None, Too numerous to count cells/hpf  POCT urine pregnancy  Result Value Ref Range   Preg Test, Ur  Negative Negative  POCT Wet + KOH Prep  Result Value Ref Range   Yeast by KOH Absent Present, Absent   Yeast by wet prep Absent Present, Absent   WBC by wet prep Moderate (A) None, Few, Too numerous to count   Clue Cells Wet Prep HPF POC None None, Too numerous to count   Trich by wet prep Absent Present, Absent   Bacteria Wet Prep HPF POC Few None, Few, Too numerous to count   Epithelial Cells By [employer] ([facility]) Many (A) None, Few, Too numerous to count   RBC,UR,HPF,POC None None RBC/hpf     Assessment and Plan: HALIMATOU BORING is a 50 y.o. female who is here today for cc of vaginal discharge and ear  pain. Her urine appears very normal at this time. Vaginal discharge is unremarkable as well. There is no abnormality of vaginal area.  Vitals are normal.   She has was concerned and wishes to see a gynecologist at this time. Will refer her at this time.  She continues to have right ear pain though I see nothing wrong with ear canal or upper respiratory. Have advised her to take an allergy medication and daily use of nasal spray.  She requests ent referral, and we can refer at this time given longevity of symptoms.  I am also referring her to endocrinology at this time.  She has reported that she would like this visit, and will attend.  I expressed the challenge of placing a referral as we did prior for her abnormal tsh levels.  She states that she is concerned and will attend the appointment she is given.  Dysuria - Plan: POCT urinalysis dipstick, POCT Microscopic Urinalysis ([facility]), POCT urine pregnancy, POCT Wet + KOH Prep  Lower abdominal pain - Plan: POCT Wet + KOH Prep, Ambulatory referral to Gynecology  Vaginal discharge - Plan: Ambulatory referral to Gynecology  Right ear pain - Plan: Ambulatory referral to ENT   Ivar Drape, PA-C [facility] 02/12/2016 6:33 PM  I personally performed the services described in this documentation,  which was scribed in my presence. The recorded information has been reviewed and is accurate.

## 2016-02-12 NOTE — Patient Instructions (Addendum)
Please await referral to ENT and gynecologist at this time.   I would like you to take the zyrtec and use the nasal spray as prescribed at this time. Your labs were unremarkable.     IF you received an x-ray today, you will receive an invoice from [facility]. Please contact [facility] at [phone] with questions or concerns regarding your invoice.   IF you received labwork today, you will receive an invoice from [employer]. Please contact [facility] at [phone] with questions or concerns regarding your invoice.   Our billing staff will not be able to assist you with questions regarding bills from these companies.  You will be contacted with the lab results as soon as they are available. The fastest way to get your results is to activate your My Chart account. Instructions are located on the last page of this paperwork. If you have not heard from [place] regarding the results in 2 weeks, please contact this office.

## 2016-02-22 ENCOUNTER — Encounter: Payer: Self-pay | Admitting: Gynecology

## 2016-02-22 ENCOUNTER — Ambulatory Visit (INDEPENDENT_AMBULATORY_CARE_PROVIDER_SITE_OTHER): Payer: BLUE CROSS/BLUE SHIELD | Admitting: Gynecology

## 2016-02-22 VITALS — BP 126/82 | Ht 63.0 in | Wt 190.0 lb

## 2016-02-22 DIAGNOSIS — R232 Flushing: Secondary | ICD-10-CM

## 2016-02-22 DIAGNOSIS — G44001 Cluster headache syndrome, unspecified, intractable: Secondary | ICD-10-CM | POA: Diagnosis not present

## 2016-02-22 DIAGNOSIS — Z01419 Encounter for gynecological examination (general) (routine) without abnormal findings: Secondary | ICD-10-CM

## 2016-02-22 DIAGNOSIS — R102 Pelvic and perineal pain: Secondary | ICD-10-CM

## 2016-02-22 DIAGNOSIS — N951 Menopausal and female climacteric states: Secondary | ICD-10-CM

## 2016-02-22 DIAGNOSIS — N915 Oligomenorrhea, unspecified: Secondary | ICD-10-CM | POA: Diagnosis not present

## 2016-02-22 DIAGNOSIS — G44009 Cluster headache syndrome, unspecified, not intractable: Secondary | ICD-10-CM | POA: Insufficient documentation

## 2016-02-22 DIAGNOSIS — N920 Excessive and frequent menstruation with regular cycle: Secondary | ICD-10-CM | POA: Insufficient documentation

## 2016-02-22 NOTE — Patient Instructions (Signed)

## 2016-02-22 NOTE — Progress Notes (Signed)
Angel Bennett 1965/11/17 [id number]   History:    50 y.o.  for annual gyn exam who is a new patient to the practice was referred by her PCP because of patient's oligomenorrhea whereby she would go several months without a menstrual cycle and then bleed heavily for 8-10 days with a lot of cramping. Patient also has had on and off vasomotor symptoms. Her PCP is Dr. Zadie Rhine who has been doing her blood work and is currently referring her to the endocrinologist because of hyperthyroidism. Patient denies any nipple discharge but she does express having headaches periodically. No double vision reported.  Past medical history,surgical history, family history and social history were all reviewed and documented in the EPIC chart.  Gynecologic History Patient's last menstrual period was 12/07/2015. Contraception: condoms Last Pap: 2015. Results were: normal Last mammogram: 2016. Results were: normal  Obstetric History OB History  Gravida Para Term Preterm AB Living  5 5       5   SAB TAB Ectopic Multiple Live Births               # Outcome Date GA Lbr Len/2nd Weight Sex Delivery Anes PTL Lv  5 Para           4 Para           3 Para           2 Para           1 Para                ROS: A ROS was performed and pertinent positives and negatives are included in the history.  GENERAL: No fevers or chills. HEENT: No change in vision, no earache, sore throat or sinus congestion. NECK: No pain or stiffness. CARDIOVASCULAR: No chest pain or pressure. No palpitations. PULMONARY: No shortness of breath, cough or wheeze. GASTROINTESTINAL: No abdominal pain, nausea, vomiting or diarrhea, melena or bright red blood per rectum. GENITOURINARY: No urinary frequency, urgency, hesitancy or dysuria. MUSCULOSKELETAL: No joint or muscle pain, no back pain, no recent trauma. DERMATOLOGIC: No rash, no itching, no lesions. ENDOCRINE: No polyuria, polydipsia, no heat or cold intolerance. No recent change in weight.  HEMATOLOGICAL: No anemia or easy bruising or bleeding. NEUROLOGIC: No headache, seizures, numbness, tingling or weakness. PSYCHIATRIC: No depression, no loss of interest in normal activity or change in sleep pattern.     Exam: chaperone present  BP 126/82   Ht 5\' 3"  (1.6 m)   Wt 190 lb (86.2 kg)   LMP 12/07/2015   BMI 33.66 kg/m   Body mass index is 33.66 kg/m.  General appearance : Well developed well nourished female. No acute distress HEENT: Eyes: no retinal hemorrhage or exudates,  Neck supple, trachea midline, no carotid bruits, no thyroidmegaly Lungs: Clear to auscultation, no rhonchi or wheezes, or rib retractions  Heart: Regular rate and rhythm, no murmurs or gallops Breast:Examined in sitting and supine position were symmetrical in appearance, no palpable masses or tenderness,  no skin retraction, no nipple inversion, no nipple discharge, no skin discoloration, no axillary or supraclavicular lymphadenopathy Abdomen: no palpable masses or tenderness, no rebound or guarding Extremities: no edema or skin discoloration or tenderness  Pelvic:  B[place]olin, Urethra, Skene Glands: Within normal limits             Vagina: No gross lesions or discharge  Cervix: No gross lesions or discharge  Uterus  anteverted, normal size, shape and consistency, non-tender and  mobile  Adnexa  right adnexal tenderness difficult to palpate a mass  Anus and perineum  normal   Rectovaginal  normal sphincter tone without palpated masses or tenderness             Hemoccult not indicated     Assessment/Plan:  50 y.o. female for annual exam who is perimenopausal which would explain her skipping cycles and some of her vasomotor symptoms for this reason we are going to check an [facility] and to rule out hyperprolactinemia we will check her prolactin level as well. She will return back to the office in a week to do a pelvic ultrasound to better assess her uterus and ovaries at that time discuss these results. She  was reminded on her mammogram which is due next month. Pap smear with HPV screening was done today.   Terrance Mass MD, 4:10 PM 02/22/2016

## 2016-02-23 ENCOUNTER — Telehealth: Payer: Self-pay | Admitting: Physician Assistant

## 2016-02-23 LAB — FOLLICLE STIMULATING HORMONE: FSH: 42.2 m[IU]/mL

## 2016-02-23 LAB — PROLACTIN: PROLACTIN: 4 ng/mL

## 2016-02-23 NOTE — Telephone Encounter (Signed)
I received info of [facility] asking to retrieve specific labs for thyroid.  I am placing in nurse box.  Please give to them the information that they would like.

## 2016-02-26 NOTE — Telephone Encounter (Signed)
Paper is in medical records.  Please send the labwork to [facility]

## 2016-02-27 LAB — PAP, TP IMAGING W/ HPV RNA, RFLX HPV TYPE 16,18/45: HPV mRNA, High Risk: NOT DETECTED

## 2016-03-20 ENCOUNTER — Encounter: Payer: Self-pay | Admitting: Gynecology

## 2016-03-20 ENCOUNTER — Ambulatory Visit (INDEPENDENT_AMBULATORY_CARE_PROVIDER_SITE_OTHER): Payer: BLUE CROSS/BLUE SHIELD

## 2016-03-20 ENCOUNTER — Ambulatory Visit (INDEPENDENT_AMBULATORY_CARE_PROVIDER_SITE_OTHER): Payer: BLUE CROSS/BLUE SHIELD | Admitting: Gynecology

## 2016-03-20 VITALS — BP 128/78

## 2016-03-20 DIAGNOSIS — Z3009 Encounter for other general counseling and advice on contraception: Secondary | ICD-10-CM

## 2016-03-20 DIAGNOSIS — N951 Menopausal and female climacteric states: Secondary | ICD-10-CM

## 2016-03-20 DIAGNOSIS — R102 Pelvic and perineal pain: Secondary | ICD-10-CM

## 2016-03-20 DIAGNOSIS — Z23 Encounter for immunization: Secondary | ICD-10-CM | POA: Diagnosis not present

## 2016-03-20 NOTE — Patient Instructions (Addendum)
Influenza Virus Vaccine (Flucelvax) What is this medicine? INFLUENZA VIRUS VACCINE (in floo EN zuh VAHY ruhs vak SEEN) helps to reduce the risk of getting influenza also known as the flu. The vaccine only helps protect you against some strains of the flu. This medicine may be used for other purposes; ask your health care provider or pharmacist if you have questions. What should I tell my health care provider before I take this medicine? They need to know if you have any of these conditions: -bleeding disorder like hemophilia -fever or infection -Guillain-Barre syndrome or other neurological problems -immune system problems -infection with the human immunodeficiency virus (HIV) or AIDS -low blood platelet counts -multiple sclerosis -an unusual or allergic reaction to influenza virus vaccine, other medicines, foods, dyes or preservatives -pregnant or trying to get pregnant -breast-feeding How should I use this medicine? This vaccine is for injection into a muscle. It is given by a health care professional. A copy of Vaccine Information Statements will be given before each vaccination. Read this sheet carefully each time. The sheet may change frequently. Talk to your pediatrician regarding the use of this medicine in children. Special care may be needed. Overdosage: If you think you've taken too much of this medicine contact a poison control center or emergency room at once. Overdosage: If you think you have taken too much of this medicine contact a poison control center or emergency room at once. NOTE: This medicine is only for you. Do not share this medicine with others. What if I miss a dose? This does not apply. What may interact with this medicine? -chemotherapy or radiation therapy -medicines that lower your immune system like etanercept, anakinra, infliximab, and adalimumab -medicines that treat or prevent blood clots like warfarin -phenytoin -steroid medicines like prednisone or  cortisone -theophylline -vaccines This list may not describe all possible interactions. Give your health care provider a list of all the medicines, herbs, non-prescription drugs, or dietary supplements you use. Also tell them if you smoke, drink alcohol, or use illegal drugs. Some items may interact with your medicine. What should I watch for while using this medicine? Report any side effects that do not go away within 3 days to your doctor or health care professional. Call your health care provider if any unusual symptoms occur within 6 weeks of receiving this vaccine. You may still catch the flu, but the illness is not usually as bad. You cannot get the flu from the vaccine. The vaccine will not protect against colds or other illnesses that may cause fever. The vaccine is needed every year. What side effects may I notice from receiving this medicine? Side effects that you should report to your doctor or health care professional as soon as possible: -allergic reactions like skin rash, itching or hives, swelling of the face, lips, or tongue Side effects that usually do not require medical attention (Report these to your doctor or health care professional if they continue or are bothersome.): -fever -headache -muscle aches and pains -pain, tenderness, redness, or swelling at the injection site -tiredness This list may not describe all possible side effects. Call your doctor for medical advice about side effects. You may report side effects to FDA at 1-800-FDA-1088. Where should I keep my medicine? The vaccine will be given by a health care professional in a clinic, pharmacy, doctor's office, or other health care setting. You will not be given vaccine doses to store at home. NOTE: This sheet is a summary. It may not cover   all possible information. If you have questions about this medicine, talk to your doctor, pharmacist, or health care provider.    2016, Elsevier/Gold Standard. (2011-05-29  14:06:47) Levonorgestrel intrauterine device (IUD) What is this medicine? LEVONORGESTREL IUD (LEE voe nor jes trel) is a contraceptive (birth control) device. The device is placed inside the uterus by a healthcare professional. It is used to prevent pregnancy and can also be used to treat heavy bleeding that occurs during your period. Depending on the device, it can be used for 3 to 5 years. This medicine may be used for other purposes; ask your health care provider or pharmacist if you have questions. What should I tell my health care provider before I take this medicine? They need to know if you have any of these conditions: -abnormal Pap smear -cancer of the breast, uterus, or cervix -diabetes -endometritis -genital or pelvic infection now or in the past -have more than one sexual partner or your partner has more than one partner -heart disease -history of an ectopic or tubal pregnancy -immune system problems -IUD in place -liver disease or tumor -problems with blood clots or take blood-thinners -use intravenous drugs -uterus of unusual shape -vaginal bleeding that has not been explained -an unusual or allergic reaction to levonorgestrel, other hormones, silicone, or polyethylene, medicines, foods, dyes, or preservatives -pregnant or trying to get pregnant -breast-feeding How should I use this medicine? This device is placed inside the uterus by a health care professional. Talk to your pediatrician regarding the use of this medicine in children. Special care may be needed. Overdosage: If you think you have taken too much of this medicine contact a poison control center or emergency room at once. NOTE: This medicine is only for you. Do not share this medicine with others. What if I miss a dose? This does not apply. What may interact with this medicine? Do not take this medicine with any of the following medications: -amprenavir -bosentan -fosamprenavir This medicine may also  interact with the following medications: -aprepitant -barbiturate medicines for inducing sleep or treating seizures -bexarotene -griseofulvin -medicines to treat seizures like carbamazepine, ethotoin, felbamate, oxcarbazepine, phenytoin, topiramate -modafinil -pioglitazone -rifabutin -rifampin -rifapentine -some medicines to treat HIV infection like atazanavir, indinavir, lopinavir, nelfinavir, tipranavir, ritonavir -St. John's wort -warfarin This list may not describe all possible interactions. Give your health care provider a list of all the medicines, herbs, non-prescription drugs, or dietary supplements you use. Also tell them if you smoke, drink alcohol, or use illegal drugs. Some items may interact with your medicine. What should I watch for while using this medicine? Visit your doctor or health care professional for regular check ups. See your doctor if you or your partner has sexual contact with others, becomes HIV positive, or gets a sexual transmitted disease. This product does not protect you against HIV infection (AIDS) or other sexually transmitted diseases. You can check the placement of the IUD yourself by reaching up to the top of your vagina with clean fingers to feel the threads. Do not pull on the threads. It is a good habit to check placement after each menstrual period. Call your doctor right away if you feel more of the IUD than just the threads or if you cannot feel the threads at all. The IUD may come out by itself. You may become pregnant if the device comes out. If you notice that the IUD has come out use a backup birth control method like condoms and call your health care provider.  Using tampons will not change the position of the IUD and are okay to use during your period. What side effects may I notice from receiving this medicine? Side effects that you should report to your doctor or health care professional as soon as possible: -allergic reactions like skin rash,  itching or hives, swelling of the face, lips, or tongue -fever, flu-like symptoms -genital sores -high blood pressure -no menstrual period for 6 weeks during use -pain, swelling, warmth in the leg -pelvic pain or tenderness -severe or sudden headache -signs of pregnancy -stomach cramping -sudden shortness of breath -trouble with balance, talking, or walking -unusual vaginal bleeding, discharge -yellowing of the eyes or skin Side effects that usually do not require medical attention (report to your doctor or health care professional if they continue or are bothersome): -acne -breast pain -change in sex drive or performance -changes in weight -cramping, dizziness, or faintness while the device is being inserted -headache -irregular menstrual bleeding within first 3 to 6 months of use -nausea This list may not describe all possible side effects. Call your doctor for medical advice about side effects. You may report side effects to FDA at 1-800-FDA-1088. Where should I keep my medicine? This does not apply. NOTE: This sheet is a summary. It may not cover all possible information. If you have questions about this medicine, talk to your doctor, pharmacist, or health care provider.    2016, Elsevier/Gold Standard. (2011-07-18 13:54:04) Perimenopause Perimenopause is the time when your body begins to move into the menopause (no menstrual period for 12 straight months). It is a natural process. Perimenopause can begin 2-8 years before the menopause and usually lasts for 1 year after the menopause. During this time, your ovaries may or may not produce an egg. The ovaries vary in their production of estrogen and progesterone hormones each month. This can cause irregular menstrual periods, difficulty getting pregnant, vaginal bleeding between periods, and uncomfortable symptoms. CAUSES  Irregular production of the ovarian hormones, estrogen and progesterone, and not ovulating every  month.  Other causes include:  Tumor of the pituitary gland in the brain.  Medical disease that affects the ovaries.  Radiation treatment.  Chemotherapy.  Unknown causes.  Heavy smoking and excessive alcohol intake can bring on perimenopause sooner. SIGNS AND SYMPTOMS   Hot flashes.  Night sweats.  Irregular menstrual periods.  Decreased sex drive.  Vaginal dryness.  Headaches.  Mood swings.  Depression.  Memory problems.  Irritability.  Tiredness.  Weight gain.  Trouble getting pregnant.  The beginning of losing bone cells (osteoporosis).  The beginning of hardening of the arteries (atherosclerosis). DIAGNOSIS  Your health care provider will make a diagnosis by analyzing your age, menstrual history, and symptoms. He or she will do a physical exam and note any changes in your body, especially your female organs. Female hormone tests may or may not be helpful depending on the amount of female hormones you produce and when you produce them. However, other hormone tests may be helpful to rule out other problems. TREATMENT  In some cases, no treatment is needed. The decision on whether treatment is necessary during the perimenopause should be made by you and your health care provider based on how the symptoms are affecting you and your lifestyle. Various treatments are available, such as:  Treating individual symptoms with a specific medicine for that symptom.  Herbal medicines that can help specific symptoms.  Counseling.  Group therapy. HOME CARE INSTRUCTIONS   Keep track of your menstrual  periods (when they occur, how heavy they are, how long between periods, and how long they last) as well as your symptoms and when they started.  Only take over-the-counter or prescription medicines as directed by your health care provider.  Sleep and rest.  Exercise.  Eat a diet that contains calcium (good for your bones) and soy (acts like the estrogen  hormone).  Do not smoke.  Avoid alcoholic beverages.  Take vitamin supplements as recommended by your health care provider. Taking vitamin E may help in certain cases.  Take calcium and vitamin D supplements to help prevent bone loss.  Group therapy is sometimes helpful.  Acupuncture may help in some cases. SEEK MEDICAL CARE IF:   You have questions about any symptoms you are having.  You need a referral to a specialist (gynecologist, psychiatrist, or psychologist). SEEK IMMEDIATE MEDICAL CARE IF:   You have vaginal bleeding.  Your period lasts longer than 8 days.  Your periods are recurring sooner than 21 days.  You have bleeding after intercourse.  You have severe depression.  You have pain when you urinate.  You have severe headaches.  You have vision problems.   This information is not intended to replace advice given to you by your health care provider. Make sure you discuss any questions you have with your health care provider.   Document Released: 07/25/2004 Document Revised: 07/08/2014 Document Reviewed: 01/14/2013 Elsevier Interactive Patient Education [employer].

## 2016-03-20 NOTE — Addendum Note (Signed)
Addended by: Thurnell Garbe A on: 03/20/2016 04:41 PM   Modules accepted: Orders

## 2016-03-20 NOTE — Progress Notes (Signed)
   Patient is a 50 year old was seen the office as a new patient last week. Patient had been referred by her PCP because of patient's oligomenorrhea whereby she would go several months without a menstrual cycle and then bleed heavily for 8-10 days with a lot of cramping. Patient also has had on and off vasomotor symptoms. Her PCP is Dr. Zadie Rhine who has been doing her blood work and is currently referring her to the endocrinologist because of hyperthyroidism. Patient denies any nipple discharge but she does express having headaches periodically. No double vision reported she had a normal prolactin level but her FSH was in the menopausal range with a value of 42. With her skipping periods with indicated she is perimenopausal. She states she's not using any form of contraception. She does not want to go on the oral contraceptive pill because she may forget to take it. Patient is interested in some form of contraception. Patient complaining of on and off loading sensation and an ultrasound had been done today with the following findings:  Uterus measured 9.4 x 5.3 x 4.1 cm with endometrial stripe of 4.1 mm. Right and left ovary were normal. No fluid in the cul-de-sac. No adnexal masses.  Assessment/plan: Patient perimenopausal not interested in oral contraceptive pill we discussed the Mirena IUD. A model shown to her and the insertion technique was discussed. She is not interested in any other form of contraception and she will schedule this sometime next week. Patient also requesting flu vaccine today. Because of her on and off bloating sensations of recommended she follow-up with the gas urologist other GYN exam and ultrasound was normal. Literature information on the perimenopause was provided. She's having currently no vasomotor symptoms not a candidate yet for hormone replacement therapy. She was also provided with requisition to schedule her overdue mammogram. Literature information was provided all the above.  All edges were answered.

## 2016-03-21 ENCOUNTER — Other Ambulatory Visit: Payer: Self-pay

## 2016-03-22 ENCOUNTER — Telehealth: Payer: Self-pay

## 2016-03-22 NOTE — Telephone Encounter (Signed)
Patient informed that I checked on Mirena ins benefits with [employer]. Covered 100%. No prior auth required. Call ref [id number]

## 2016-03-29 ENCOUNTER — Encounter: Payer: Self-pay | Admitting: Gynecology

## 2016-03-29 ENCOUNTER — Ambulatory Visit (INDEPENDENT_AMBULATORY_CARE_PROVIDER_SITE_OTHER): Payer: BLUE CROSS/BLUE SHIELD | Admitting: Gynecology

## 2016-03-29 VITALS — BP 118/80 | Ht 63.0 in | Wt 190.0 lb

## 2016-03-29 DIAGNOSIS — Z975 Presence of (intrauterine) contraceptive device: Secondary | ICD-10-CM | POA: Insufficient documentation

## 2016-03-29 DIAGNOSIS — Z30430 Encounter for insertion of intrauterine contraceptive device: Secondary | ICD-10-CM | POA: Diagnosis not present

## 2016-03-29 DIAGNOSIS — N951 Menopausal and female climacteric states: Secondary | ICD-10-CM

## 2016-03-29 LAB — PREGNANCY, URINE: PREG TEST UR: NEGATIVE

## 2016-03-29 NOTE — Patient Instructions (Signed)

## 2016-03-29 NOTE — Progress Notes (Signed)
   Patient is a 50 year old who is perimenopausal and does not use contraception at times. Her last menstrual period was in June. We did a urine cleansing takes hearing office today which was negative. Patient had nonspecific low abdominal cramping when she was seen on September 20 and her ultrasound was otherwise normal. Patient had been on the pill many years ago but tends to forget and we have discussed the Mirena IUD and she's here for placement of Mirena IUD. Patient been provided with literature information. Risk benefits and pros and cons of been discussed. Patient had an [facility] on August 24 of value of 42.2.                                                                    IUD procedure note       Patient presented to the office today for placement of Mirena IUD. The patient had previously been provided with literature information on this method of contraception. The risks benefits and pros and cons were discussed and all her questions were answered. She is fully aware that this form of contraception is 99% effective and is good for 5 years.  Pelvic exam: Bartholin urethra Skene glands: Within normal limits Vagina: No lesions or discharge Cervix: No lesions or discharge Uterus: Anteverted position Adnexa: No masses or tenderness Rectal exam: Not done  The cervix was cleansed with Betadine solution. A single-tooth tenaculum was placed on the anterior cervical lip. The uterus sounded to 7-1/2 centimeter. The IUD was shown to the patient and inserted in a sterile fashion. The IUD string was trimmed. The single-tooth tenaculum was removed. Patient was instructed to return back to the office in one month for follow up.

## 2016-04-03 ENCOUNTER — Encounter: Payer: Self-pay | Admitting: Anesthesiology

## 2016-04-09 ENCOUNTER — Ambulatory Visit (INDEPENDENT_AMBULATORY_CARE_PROVIDER_SITE_OTHER): Payer: BLUE CROSS/BLUE SHIELD | Admitting: Internal Medicine

## 2016-04-09 ENCOUNTER — Encounter: Payer: Self-pay | Admitting: Internal Medicine

## 2016-04-09 DIAGNOSIS — E0590 Thyrotoxicosis, unspecified without thyrotoxic crisis or storm: Secondary | ICD-10-CM

## 2016-04-09 LAB — TSH: TSH: 0.23 u[IU]/mL — AB (ref 0.35–4.50)

## 2016-04-09 LAB — T4, FREE: Free T4: 0.68 ng/dL (ref 0.60–1.60)

## 2016-04-09 LAB — T3, FREE: T3 FREE: 3.2 pg/mL (ref 2.3–4.2)

## 2016-04-09 NOTE — Progress Notes (Addendum)
Patient ID: Angel Bennett, female   DOB: August 04, 1965, 50 y.o.   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 She is here with her husband who offers part of the history.  I reviewed pt's thyroid tests - she has had low TSH levels at least since 2008: Lab Results  Component Value Date   TSH 0.22 (L) 11/22/2015   TSH 0.331 (L) 10/10/2014   TSH 0.265 (L) 07/20/2008   TSH 0.177 (L) 06/07/2008   TSH 0.342 (L) 04/17/2007   FREET4 1.1 11/22/2015   FREET4 1.10 10/10/2014    Of note, patient had a normal thyroid ultrasound in 2009.  Pt mentions she feels nodules in neck, hoarseness, dysphagia/odynophagia, + choking and coughing at night, + SOB with lying down; she also c/o starting to have this following symptoms in the last few months: - + mental fog - + fatigue, + wakes up frequently during the night - + heat and cold intolerance -  no tremors - + anxiety - + palpitations - no hyperdefecation - + weight gain - + hair loss  Pt does not have a FH of thyroid ds.: mother side of the family. No FH of thyroid cancer. No h/o radiation tx to head or neck.  No seaweed or kelp, no recent contrast studies. No steroid use. No herbal supplements. No Biotin use.  Pt. also has a history of anxiety/depression, GERD.  ROS: Constitutional:  + see history of present illnesses: no blurry vision, no xerophthalmia ENT:  + sore throat,  + see history of present illness, + decreased hearing, tinnitus  Cardiovascular:  +CP/ + SOB/ + palpitations/ + leg swelling Respiratory:  + cough/+ shortness of breath Gl: no N/V/D/+ C/+ heartburn  Musculoskeletal: + muscle aches/no joint aches Skin: no rashes, + hair loss Neurological: no tremors/numbness/tingling/dizziness Psychiatric: + depression/+ anxiety + low libido  Past Medical History:  Diagnosis Date  . Abdominal pain    Diffuse mild  . C.  difficile colitis [id number]   Treated w PO Metronidazole  . Chest pain 05/20/2015  . Constipation, chronic   . GERD (gastroesophageal reflux disease)   . Hx of adenomatous polyp of colon 10/27/2014  . Hyperthyroidism 05/20/2015  . Palpitations 05/20/2015  . Ringing in ears    both ears   Past Surgical History:  Procedure Laterality Date  . CESAREAN SECTION  1990  . CHOLECYSTECTOMY  03/13/2011   lap single site  . COLONOSCOPY  05/15/2011   Procedure: COLONOSCOPY;  Surgeon: Landry Dyke, MD;  Location: [facility];  Service: Endoscopy;  Laterality: N/A;  . ESOPHAGOGASTRODUODENOSCOPY  05/15/2011   Procedure: ESOPHAGOGASTRODUODENOSCOPY (EGD);  Surgeon: Landry Dyke, MD;  Location: [facility];  Service: Endoscopy;  Laterality: N/A;  . [place]   Social History   Social History  . Marital status: Married    Spouse name: N/A  . Number of children: 5    Occupational History  . Inspector    Social History Main Topics  . Smoking status: Never Smoker  . Smokeless tobacco: Never Used  . Alcohol use No  . Drug use: No   Social History Narrative   She is married she has 4 sons and one daughter. 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 Updated 10/26/2014         Epworth Sleepiness Scale = 9 (as of 05/19/15)  Current Outpatient Prescriptions on File Prior to Visit  Medication Sig Dispense Refill  . Pseudoephedrine-Guaifenesin (MUCINEX D PO) Take by mouth.     No current facility-administered medications on file prior to visit.    No Known Allergies Family History  Problem Relation Age of Onset  . Diabetes Mother 75  . Hypertension Mother   . Hypertension Father     PE: BP 110/72 (BP Location: Left Arm, Patient Position: Sitting)   Pulse 71   Ht 5\' 3"  (1.6 m)   Wt 191 lb (86.6 kg)   SpO2 99%   BMI 33.83 kg/m  Wt Readings from Last 3 Encounters:  04/09/16 191 lb (86.6 kg)  03/29/16 190 lb (86.2 kg)  02/22/16 190 lb  (86.2 kg)   Constitutional: overweight, in NAD Eyes: PERRLA, EOMI, no exophthalmos, no lid lag, no stare ENT: moist mucous membranes, + thyromegaly L>R, no thyroid bruits, no cervical lymphadenopathy Cardiovascular: RRR, No MRG Respiratory: CTA B Gastrointestinal: abdomen soft, NT, ND, BS+ Musculoskeletal: no deformities, strength intact in all 4 Skin: moist, warm, no rashes Neurological: no tremor with outstretched hands, DTR normal in all 4  ASSESSMENT: 1. Subclinical Thyrotoxicosis  PLAN:  1. Patient with a long h/o mildly low TSH, with thyrotoxic sxs: heat intolerance, palpitations, anxiety. She does not have weight loss or tremors. - she does not appear to have exogenous causes for the low TSH.  - We discussed that possible causes of thyrotoxicosis are:  Graves ds   Thyroiditis toxic multinodular goiter/ toxic adenoma (at palpation of her thyroid, she has a degree of thyromegaly, but no individual nodules palpated. A previous thyroid ultrasound from 2008 was normal). - I suggested that we check the TSH, fT3 and fT4 and also add thyroid stimulating antibodies to screen for Graves' disease.  - If the tests remain abnormal,  will get an uptake and scan to differentiate between the 3 above possible etiologies  - we discussed about possible modalities of treatment for the above conditions, to include methimazole use, radioactive iodine ablation or (last resort) surgery. - If the tests are normal, we'll proceed with a thyroid ultrasound as she has some neck compression symptoms and has asymmetry of the thyroid on palpation  - I do not feel that we need to add beta blockers at this time, since she is not tachycardic, anxious, or tremulous - I advised her to join my chart to communicate easier - RTC in 3 months, but likely sooner for repeat labs  Component     Latest Ref Rng & Units 04/09/2016  TSH     0.35 - 4.50 uIU/mL 0.23 (L)  T4,Free(Direct)     0.60 - 1.60 ng/dL 0.68   Triiodothyronine,Free,Serum     2.3 - 4.2 pg/mL 3.2  TSI     <140 % baseline 268 (H)   TSH is still slightly low, and her TSI's are high. I suspect mild Graves' disease. We'll proceed with a thyroid uptake and scan. Since her hyperthyroidism is so mild, will postpone starting methimazole until after the uptake and scan.  NM THYROID SNG UPTAKE W/IMAGING  Order: [id number]  Status:  Final result Visible to patient:  No (Not Released) Dx:  Subclinical hyperthyroidism  Details   Reading Physician Reading Date Result Priority  Lavonia Dana, MD 04/24/2016   Narrative    CLINICAL DATA: Subclinical hyperthyroidism  EXAM: THYROID SCAN AND UPTAKE - 24 HOURS  TECHNIQUE: Following the per oral administration of I-131 sodium iodide, the patient returned at  24 hours and uptake measurements were acquired with the uptake probe centered on the neck. Thyroid imaging was performed following the intravenous administration of the Tc-77m Pertechnetate.  RADIOPHARMACEUTICALS: 7.7 MicroCuries I-131 sodium iodide orally and 10.4 mCi Technetium-82m pertechnetate IV  COMPARISON: None  FINDINGS: 24 hour radio iodine uptake calculated at 12%, within the normal range.  Images of the thyroid gland in 3 projections demonstrate homogeneous tracer distribution in both thyroid lobes.  No focal areas of increased or decreased tracer localization seen.  IMPRESSION: Normal 24 hour radio iodine uptake of 12%.  Normal thyroid scan.   Electronically Signed By: Lavonia Dana M.D. On: 04/24/2016 14:51       Normal thyroid uptake and scan. In this case, no intervention is needed, but we will need to repeat her TFTs approximately 5 weeks after the previous tests. If these return abnormal, we may use a low dose methimazole.  Philemon Kingdom, MD PhD [facility] Endocrinology

## 2016-04-09 NOTE — Patient Instructions (Addendum)
Please stop at the lab.  If the results remain abnormal, we will schedule a thyroid uptake and scan.   If the tests are normal, we may need a thyroid ultrasound.  Please come back for a follow-up appointment in 3 months.   Hyperthyroidism Hyperthyroidism is when the thyroid is too active (overactive). Your thyroid is a large gland that is located in your neck. The thyroid helps to control how your body uses food (metabolism). When your thyroid is overactive, it produces too much of a hormone called thyroxine.  CAUSES Causes of hyperthyroidism may include:  Graves disease. This is when your immune system attacks the thyroid gland. This is the most common cause.  Inflammation of the thyroid gland.  Tumor in the thyroid gland or somewhere else.  Excessive use of thyroid medicines, including:  Prescription thyroid supplement.  Herbal supplements that mimic thyroid hormones.  Solid or fluid-filled lumps within your thyroid gland (thyroid nodules).  Excessive ingestion of iodine. RISK FACTORS  Being female.  Having a family history of thyroid conditions. SIGNS AND SYMPTOMS Signs and symptoms of hyperthyroidism may include:  Nervousness.  Inability to tolerate heat.  Unexplained weight loss.  Diarrhea.  Change in the texture of hair or skin.  Heart skipping beats or making extra beats.  Rapid heart rate.  Loss of menstruation.  Shaky hands.  Fatigue.  Restlessness.  Increased appetite.  Sleep problems.  Enlarged thyroid gland or nodules. DIAGNOSIS  Diagnosis of hyperthyroidism may include:  Medical history and physical exam.  Blood tests.  Ultrasound tests. TREATMENT Treatment may include:  Medicines to control your thyroid.  Surgery to remove your thyroid.  Radiation therapy. HOME CARE INSTRUCTIONS   Take medicines only as directed by your health care provider.  Do not use any tobacco products, including cigarettes, chewing tobacco, or  electronic cigarettes. If you need help quitting, ask your health care provider.  Do not exercise or do physical activity until your health care provider approves.  Keep all follow-up appointments as directed by your health care provider. This is important. SEEK MEDICAL CARE IF:  Your symptoms do not get better with treatment.  You have fever.  You are taking thyroid replacement medicine and you:  Have depression.  Feel mentally and physically slow.  Have weight gain. SEEK IMMEDIATE MEDICAL CARE IF:   You have decreased alertness or a change in your awareness.  You have abdominal pain.  You feel dizzy.  You have a rapid heartbeat.  You have an irregular heartbeat.   This information is not intended to replace advice given to you by your health care provider. Make sure you discuss any questions you have with your health care provider.   Document Released: 06/17/2005 Document Revised: 07/08/2014 Document Reviewed: 11/02/2013 Elsevier Interactive Patient Education [employer].

## 2016-04-14 LAB — THYROID STIMULATING IMMUNOGLOBULIN: TSI: 268 %{baseline} — AB (ref <140)

## 2016-04-15 ENCOUNTER — Telehealth: Payer: Self-pay

## 2016-04-15 NOTE — Telephone Encounter (Signed)
Called patient and gave lab results. Patient had no questions or concerns.  

## 2016-04-23 ENCOUNTER — Encounter (HOSPITAL_COMMUNITY)
Admission: RE | Admit: 2016-04-23 | Discharge: 2016-04-23 | Disposition: A | Discharge status: Home / Self Care | Payer: BLUE CROSS/BLUE SHIELD | Source: Ambulatory Visit | Attending: Internal Medicine | Admitting: Internal Medicine

## 2016-04-23 DIAGNOSIS — E0590 Thyrotoxicosis, unspecified without thyrotoxic crisis or storm: Secondary | ICD-10-CM | POA: Insufficient documentation

## 2016-04-23 MED ORDER — SODIUM IODIDE I 131 CAPSULE
7.7000 | Freq: Once | INTRAVENOUS | Status: AC | PRN
  Administered 2016-04-23: 7.7 via ORAL

## 2016-04-24 ENCOUNTER — Encounter (HOSPITAL_COMMUNITY)
Admission: RE | Admit: 2016-04-24 | Discharge: 2016-04-24 | Disposition: A | Discharge status: Home / Self Care | Payer: BLUE CROSS/BLUE SHIELD | Source: Ambulatory Visit | Attending: Internal Medicine | Admitting: Internal Medicine

## 2016-04-24 DIAGNOSIS — E0590 Thyrotoxicosis, unspecified without thyrotoxic crisis or storm: Secondary | ICD-10-CM | POA: Diagnosis present

## 2016-04-24 MED ORDER — SODIUM PERTECHNETATE TC 99M INJECTION
10.4000 | Freq: Once | INTRAVENOUS | Status: AC | PRN
  Administered 2016-04-24: 10.4 via INTRAVENOUS

## 2016-04-25 ENCOUNTER — Telehealth: Payer: Self-pay

## 2016-04-25 NOTE — Telephone Encounter (Signed)
Called patient and left message and gave results.  Advised to come back in one month for lab work, gave office number to call to make appointment.

## 2016-04-25 NOTE — Addendum Note (Signed)
Addended by: Philemon Kingdom on: 04/25/2016 08:08 AM   Modules accepted: Orders

## 2016-05-22 ENCOUNTER — Other Ambulatory Visit (INDEPENDENT_AMBULATORY_CARE_PROVIDER_SITE_OTHER): Payer: BLUE CROSS/BLUE SHIELD

## 2016-05-22 DIAGNOSIS — E0590 Thyrotoxicosis, unspecified without thyrotoxic crisis or storm: Secondary | ICD-10-CM

## 2016-05-22 LAB — T4, FREE: FREE T4: 0.78 ng/dL (ref 0.60–1.60)

## 2016-05-22 LAB — TSH: TSH: 0.28 u[IU]/mL — AB (ref 0.35–4.50)

## 2016-05-22 LAB — T3, FREE: T3 FREE: 3.4 pg/mL (ref 2.3–4.2)

## 2016-05-31 NOTE — Progress Notes (Signed)
Spoke with patient & informed   The TSH is better, only slightly under the lower limit of normal. No intervention is needed at this point, but I will repeat her tests when she comes back in January.

## 2016-06-04 ENCOUNTER — Ambulatory Visit: Payer: BLUE CROSS/BLUE SHIELD | Admitting: Internal Medicine

## 2016-07-11 ENCOUNTER — Ambulatory Visit (INDEPENDENT_AMBULATORY_CARE_PROVIDER_SITE_OTHER): Payer: BLUE CROSS/BLUE SHIELD | Admitting: Internal Medicine

## 2016-07-11 ENCOUNTER — Encounter: Payer: Self-pay | Admitting: Internal Medicine

## 2016-07-11 VITALS — BP 120/88 | HR 69 | Ht 63.0 in | Wt 194.0 lb

## 2016-07-11 DIAGNOSIS — E0580 Other thyrotoxicosis without thyrotoxic crisis or storm: Secondary | ICD-10-CM | POA: Diagnosis not present

## 2016-07-11 DIAGNOSIS — E0590 Thyrotoxicosis, unspecified without thyrotoxic crisis or storm: Secondary | ICD-10-CM | POA: Insufficient documentation

## 2016-07-11 LAB — T3, FREE: T3 FREE: 3.1 pg/mL (ref 2.3–4.2)

## 2016-07-11 LAB — T4, FREE: FREE T4: 0.79 ng/dL (ref 0.60–1.60)

## 2016-07-11 LAB — TSH: TSH: 0.4 u[IU]/mL (ref 0.35–4.50)

## 2016-07-11 NOTE — Progress Notes (Signed)
Patient ID: Angel Bennett, female   DOB: 26-Jul-1965, 51 y.o.   MRN: [id number]   HPI  Angel Bennett is a 51 y.o.-year-old female, returning for f/u for subclinical thyrotoxicosis. Last visit 3 mo ago.  I reviewed pt's thyroid tests - she has had low TSH levels at least since 2008: Lab Results  Component Value Date   TSH 0.28 (L) 05/22/2016   TSH 0.23 (L) 04/09/2016   TSH 0.22 (L) 11/22/2015   TSH 0.331 (L) 10/10/2014   TSH 0.265 (L) 07/20/2008   TSH 0.177 (L) 06/07/2008   TSH 0.342 (L) 04/17/2007   FREET4 0.78 05/22/2016   FREET4 0.68 04/09/2016   FREET4 1.1 11/22/2015   FREET4 1.10 10/10/2014    Of note, patient had a normal thyroid ultrasound in 2009.  At last visit, we checked Graves Abs and they were elevated: Component     Latest Ref Rng & Units 04/09/2016  TSI     <140 % baseline 268 (H)   However, a Thyroid Uptake and scan (04/24/2016) showed: Normal 24 hour radio iodine uptake of 12%. Normal thyroid scan.  As her TFTs were improving, we decided to follow her TFTs w/o tx.  Pt mentions she feels nodules in neck, hoarseness, dysphagia/odynophagia, no SOB with lying down; she also c/o: - + mental fog, memory pbs  - + fatigue - + heat and cold intolerance -  no tremors - no palpitations - no hyperdefecation - + weight gain - + hair loss  Pt does not have a FH of thyroid ds.: mother side of the family. No FH of thyroid cancer. No h/o radiation tx to head or neck.  No seaweed or kelp, no recent contrast studies. No steroid use. No herbal supplements. No Biotin use.  Pt. also has a history of anxiety/depression, GERD.  ROS: Constitutional:  + see history of present illnesses: no blurry vision, no xerophthalmia ENT:  no sore throat,  + see history of present illness Cardiovascular:  noCP/SOB/palpitations/leg swelling Respiratory:  No cough/shortness of breath Gl: no N/V/D/C/heartburn  Musculoskeletal: + muscle aches/no joint aches Skin: no rashes, + hair  loss Neurological: no tremors/numbness/tingling/dizziness  I reviewed pt's medications, allergies, PMH, social hx, family hx, and changes were documented in the history of present illness. Otherwise, unchanged from my initial visit note.  Past Medical History:  Diagnosis Date  . Abdominal pain    Diffuse mild  . C. difficile colitis [id number]   Treated w PO Metronidazole  . Chest pain 05/20/2015  . Constipation, chronic   . GERD (gastroesophageal reflux disease)   . Hx of adenomatous polyp of colon 10/27/2014  . Hyperthyroidism 05/20/2015  . Palpitations 05/20/2015  . Ringing in ears    both ears   Past Surgical History:  Procedure Laterality Date  . CESAREAN SECTION  1990  . CHOLECYSTECTOMY  03/13/2011   lap single site  . COLONOSCOPY  05/15/2011   Procedure: COLONOSCOPY;  Surgeon: Landry Dyke, MD;  Location: [facility];  Service: Endoscopy;  Laterality: N/A;  . ESOPHAGOGASTRODUODENOSCOPY  05/15/2011   Procedure: ESOPHAGOGASTRODUODENOSCOPY (EGD);  Surgeon: Landry Dyke, MD;  Location: [facility];  Service: Endoscopy;  Laterality: N/A;  . [place]   Social History   Social History  . Marital status: Married    Spouse name: N/A  . Number of children: 5    Occupational History  . Inspector    Social History Main Topics  . Smoking status: Never Smoker  .  Smokeless tobacco: Never Used  . Alcohol use No  . Drug use: No   Social History Narrative   She is married she has 4 sons and one daughter. 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 Updated 10/26/2014         Epworth Sleepiness Scale = 9 (as of 05/19/15)   Current Outpatient Prescriptions on File Prior to Visit  Medication Sig Dispense Refill  . Pseudoephedrine-Guaifenesin (MUCINEX D PO) Take by mouth.     No current facility-administered medications on file prior to visit.    No Known Allergies Family History  Problem Relation Age of Onset  . Diabetes  Mother 61  . Hypertension Mother   . Hypertension Father     PE: BP 120/88 (BP Location: Left Arm, Patient Position: Sitting)   Pulse 69   Ht 5\' 3"  (1.6 m)   Wt 194 lb (88 kg)   SpO2 98%   BMI 34.37 kg/m  Wt Readings from Last 3 Encounters:  07/11/16 194 lb (88 kg)  04/09/16 191 lb (86.6 kg)  03/29/16 190 lb (86.2 kg)   Constitutional: overweight, in NAD Eyes: PERRLA, EOMI, no exophthalmos, no lid lag, no stare ENT: moist mucous membranes, + thyromegaly L>R, no thyroid bruits, no cervical lymphadenopathy Cardiovascular: RRR, No MRG Respiratory: CTA B Gastrointestinal: abdomen soft, NT, ND, BS+ Musculoskeletal: no deformities, strength intact in all 4 Skin: moist, warm, no rashes Neurological: no tremor with outstretched hands, DTR normal in all 4  ASSESSMENT: 1. Subclinical Thyrotoxicosis  PLAN:  1. Patient with a long h/o mildly low TSH, with nl free thyroid hhs, and with thyrotoxic sxs: heat intolerance, palpitations, anxiety. She does not have weight loss or tremors. Her TFTs were improved at last check. A thyroid Uptake and Scan was normal. - she does not appear to have exogenous causes for the low TSH.  - We discussed that possible causes of improving thyrotoxicosis are:  Improving Graves ds   Resolving Thyroiditis - I suggested that we check the TSH, fT3 and fT4 today.  - If the tests worsens, she may need low dose MMI, but we discussed that her sxs could also be related to her weight gain (30 lbs in last year), menopause, vitamins deficiencies (she restarted MVI few days ago). - I do not feel that we need to add beta blockers at this time, since she is not tachycardic, anxious, or tremulous - RTC in 6 months, but possibly sooner for repeat labs  Office Visit on 07/11/2016  Component Date Value Ref Range Status  . Free T4 07/11/2016 0.79  0.60 - 1.60 ng/dL Final   Comment: Specimens from patients who are undergoing biotin therapy and /or ingesting biotin supplements  may contain high levels of biotin.  The higher biotin concentration in these specimens interferes with this Free T4 assay.  Specimens that contain high levels  of biotin may cause false high results for this Free T4 assay.  Please interpret results in light of the total clinical presentation of the patient.    . T3, Free 07/11/2016 3.1  2.3 - 4.2 pg/mL Final  . TSH 07/11/2016 0.40  0.35 - 4.50 uIU/mL Final   Normal TFTs.  Philemon Kingdom, MD PhD [facility] Endocrinology

## 2016-07-11 NOTE — Patient Instructions (Signed)
Please stop at the lab.  Please come back for a follow-up appointment in 6 months.  

## 2016-07-12 ENCOUNTER — Telehealth: Payer: Self-pay

## 2016-07-12 NOTE — Telephone Encounter (Signed)
Called patient and gave lab results. Patient had no questions or concerns.  

## 2016-07-16 ENCOUNTER — Other Ambulatory Visit: Payer: Self-pay | Admitting: Family Medicine

## 2016-07-16 ENCOUNTER — Other Ambulatory Visit: Payer: Self-pay

## 2016-07-16 DIAGNOSIS — N6002 Solitary cyst of left breast: Secondary | ICD-10-CM

## 2016-07-17 ENCOUNTER — Ambulatory Visit: Payer: BLUE CROSS/BLUE SHIELD

## 2016-07-20 ENCOUNTER — Ambulatory Visit (INDEPENDENT_AMBULATORY_CARE_PROVIDER_SITE_OTHER): Payer: BLUE CROSS/BLUE SHIELD | Admitting: Family Medicine

## 2016-07-20 VITALS — BP 130/76 | HR 72 | Temp 98.1°F | Resp 16 | Ht 63.0 in | Wt 193.6 lb

## 2016-07-20 DIAGNOSIS — B3789 Other sites of candidiasis: Secondary | ICD-10-CM | POA: Diagnosis not present

## 2016-07-20 LAB — POCT SKIN KOH: SKIN KOH, POC: NEGATIVE

## 2016-07-20 MED ORDER — NYSTATIN-TRIAMCINOLONE 100000-0.1 UNIT/GM-% EX OINT: 1.0000 "application " | TOPICAL_OINTMENT | Freq: Two times a day (BID) | CUTANEOUS | 1 refills | Status: DC

## 2016-07-20 MED ORDER — NYSTATIN 100000 UNIT/GM EX POWD: Freq: Four times a day (QID) | CUTANEOUS | 2 refills | Status: DC

## 2016-07-20 MED ORDER — KETOCONAZOLE 2 % EX CREA: 1.0000 "application " | TOPICAL_CREAM | Freq: Every day | CUTANEOUS | 2 refills | Status: DC

## 2016-07-20 NOTE — Progress Notes (Signed)
Subjective:   07/20/2016 , 4:24 PM .  Patient was seen in Room 10 .   Patient ID: Angel Bennett, female    DOB: December 23, 1965, 51 y.o.   MRN: [id number] Chief Complaint  Patient presents with  . Breast Pain    x 1 week  . Pruritis    Breast Bilateral   HPI Angel Bennett is a 51 y.o. female who presents to [facility] Care at [facility] complaining of breast pain and bilateral breast itchiness ongoing for over a week now.    Past Medical History:  Diagnosis Date  . Abdominal pain    Diffuse mild  . C. difficile colitis [id number]   Treated w PO Metronidazole  . Chest pain 05/20/2015  . Constipation, chronic   . GERD (gastroesophageal reflux disease)   . Hx of adenomatous polyp of colon 10/27/2014  . Hyperthyroidism 05/20/2015  . Palpitations 05/20/2015  . Ringing in ears    both ears   Prior to Admission medications   Not on File   No Known Allergies   Past Surgical History:  Procedure Laterality Date  . CESAREAN SECTION  1990  . CHOLECYSTECTOMY  03/13/2011   lap single site  . COLONOSCOPY  05/15/2011   Procedure: COLONOSCOPY;  Surgeon: Landry Dyke, MD;  Location: [facility];  Service: Endoscopy;  Laterality: N/A;  . ESOPHAGOGASTRODUODENOSCOPY  05/15/2011   Procedure: ESOPHAGOGASTRODUODENOSCOPY (EGD);  Surgeon: Landry Dyke, MD;  Location: [facility];  Service: Endoscopy;  Laterality: N/A;  . HEMORRHOID SURGERY  1990   Family History  Problem Relation Age of Onset  . Diabetes Mother 60  . Hypertension Mother   . Hypertension Father    Social History   Social History  . Marital status: Married    Spouse name: N/A  . Number of children: N/A  . Years of education: N/A   Social History Main Topics  . Smoking status: Never Smoker  . Smokeless tobacco: Never Used  . Alcohol use No  . Drug use: No  . Sexual activity: Yes    Birth control/ protection: None   Other Topics Concern  . None   Social History Narrative   She is married she has 4 sons and one  daughter. 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 Updated 10/26/2014         Epworth Sleepiness Scale = 9 (as of 05/19/15)    Review of Systems  Constitutional: Negative for chills, diaphoresis and fever.  Musculoskeletal: Positive for myalgias. Negative for arthralgias and joint swelling.  Skin: Positive for color change, rash and wound. Negative for pallor.  Hematological: Negative for adenopathy. Does not bruise/bleed easily.       Objective:   Physical Exam  Constitutional: She is oriented to person, place, and time. She appears well-developed and well-nourished. No distress.  HENT:  Head: Normocephalic and atraumatic.  Eyes: EOM are normal. Pupils are equal, round, and reactive to light.  Neck: Neck supple.  Cardiovascular: Normal rate.   Pulmonary/Chest: Effort normal. No respiratory distress.  Bilateral breast with some very mild erythema and question of slight swelling over medial aspect approximately 4 o'clock distribution. Hyperpigmentation and warmth on the distal aspect of each breast and corresponding upper abdomen with some friability and increased scaling of the skin with light abrasion along the intertriginous zones. Speckled hyperpigmentation and wrinkling of the skin around bilateral nipples consistent with aging changes with large pendulous breasts  Musculoskeletal: Normal range of motion.  Neurological: She is alert and oriented to person, place, and time.  Skin: Skin is warm and dry.  Psychiatric: She has a normal mood and affect. Her behavior is normal.  Nursing note and vitals reviewed.   BP 130/76   Pulse 72   Temp 98.1 F (36.7 C) (Oral)   Resp 16   Ht 5\' 3"  (1.6 m)   Wt 193 lb 9.6 oz (87.8 kg)   SpO2 100%   BMI 34.29 kg/m     Assessment & Plan:   1. Candidiasis of breast   Do not need to use all of below - I told pt she can choose whichever is most reasonable cost - apply treatment a total of 2-4x/d. Keep area dry as  possible by using powder at work and using bras that are cotton and can wick away moisture. Wash all bras/shirts in hot water with hypoallergenic detergent. Cont cool water in shower with Dove soap and olive oil for moisturizer. Pt has diagnostic mammogram and left breast US to follow-up from   Orders Placed This Encounter  Procedures  . POCT Skin KOH    Meds ordered this encounter  Medications  . nystatin-triamcinolone ointment (MYCOLOG)    Sig: Apply 1 application topically 2 (two) times daily.    Dispense:  60 g    Refill:  1  . nystatin (MYCOSTATIN/NYSTOP) powder    Sig: Apply topically 4 (four) times daily.    Dispense:  60 g    Refill:  2  . ketoconazole (NIZORAL) 2 % cream    Sig: Apply 1 application topically daily.    Dispense:  60 g    Refill:  2      Delman Cheadle, M.D.  [facility] [address] [place], [place] [address] [phone] phone [phone] fax  07/20/16 5:32 PM

## 2016-07-20 NOTE — Patient Instructions (Addendum)
IF you received an x-ray today, you will receive an invoice from [facility]. Please contact [facility] at [phone] with questions or concerns regarding your invoice.   IF you received labwork today, you will receive an invoice from [place]. Please contact [facility] at [phone] with questions or concerns regarding your invoice.   Our billing staff will not be able to assist you with questions regarding bills from these companies.  You will be contacted with the lab results as soon as they are available. The fastest way to get your results is to activate your My Chart account. Instructions are located on the last page of this paperwork. If you have not heard from [place] regarding the results in 2 weeks, please contact this office.    Candidiasis The Candida organism is a fungus that lives in our bodies. It produces yeast cells and is kept at healthy levels by the natural bacteria in our bodies. Candida lives in warm, dark, and moist places of the body, such as skin folds under the breast and wet nipples covered by bras. When your body's natural balance of bacteria is upset, Candidacan overgrow, causing an infection. This type of infection is called candidiasis.  WHAT ARE THE SYMPTOMS OF CANDIDIASIS?  Severe stinging or burning pain, which may be on the surface of the nipples or may be felt deep inside the breast.   Sharp, shooting pain that spreads (radiates) from the nipple into the breast or into the back or arm.   Sensitive nipples that may have pain with even a light touch. Nipples and skin of breast may also be:   Puffy.  Weepy.   Itchy.   Blistering.  Cracked.  Scaly.   Reddish.  Shiny.  Flaky.    Skin Yeast Infection Skin yeast infection is a condition in which there is an overgrowth of yeast (candida) that normally lives on the skin. This condition usually occurs in areas of the skin that are constantly warm and moist, such as  the armpits or the groin. What are the causes? This condition is caused by a change in the normal balance of the yeast and bacteria that live on the skin. What increases the risk? This condition is more likely to develop in:  People who are obese.  Pregnant women.  Women who take birth control pills.  People who have diabetes.  People who take antibiotic medicines.  People who take steroid medicines.  People who are malnourished.  People who have a weak defense (immune) system.  People who are 50 years of age or older. What are the signs or symptoms? Symptoms of this condition include:  A red, swollen area of the skin.  Bumps on the skin.  Itchiness. How is this diagnosed? This condition is diagnosed with a medical history and physical exam. Your health care provider may check for yeast by taking light scrapings of the skin to be viewed under a microscope. How is this treated? This condition is treated with medicine. Medicines may be prescribed or be available over-the-counter. The medicines may be:  Taken by mouth (orally).  Applied as a cream. Follow these instructions at home:  Take or apply over-the-counter and prescription medicines only as told by your health care provider.  Eat more yogurt. This may help to keep your yeast infection from returning.  Maintain a healthy weight. If you need help losing weight, talk with your health care provider.  Keep your skin clean and dry.  If you  have diabetes, keep your blood sugar under control. Contact a health care provider if:  Your symptoms go away and then return.  Your symptoms do not get better with treatment.  Your symptoms get worse.  Your rash spreads.  You have a fever or chills.  You have new symptoms.  You have new warmth or redness of your skin. This information is not intended to replace advice given to you by your health care provider. Make sure you discuss any questions you have with your  health care provider. Document Released: 03/05/2011 Document Revised: 02/11/2016 Document Reviewed: 12/19/2014 Elsevier Interactive Patient Education  2017 [employer].

## 2016-07-29 ENCOUNTER — Ambulatory Visit
Admission: RE | Admit: 2016-07-29 | Discharge: 2016-07-29 | Disposition: A | Discharge status: Home / Self Care | Payer: BLUE CROSS/BLUE SHIELD | Source: Ambulatory Visit | Attending: Family Medicine | Admitting: Family Medicine

## 2016-07-29 DIAGNOSIS — N6002 Solitary cyst of left breast: Secondary | ICD-10-CM

## 2016-08-01 ENCOUNTER — Encounter: Payer: Self-pay | Admitting: Gynecology

## 2016-08-01 ENCOUNTER — Ambulatory Visit (INDEPENDENT_AMBULATORY_CARE_PROVIDER_SITE_OTHER): Payer: BLUE CROSS/BLUE SHIELD | Admitting: Gynecology

## 2016-08-01 VITALS — BP 120/78 | Ht 63.0 in | Wt 193.0 lb

## 2016-08-01 DIAGNOSIS — Z975 Presence of (intrauterine) contraceptive device: Secondary | ICD-10-CM

## 2016-08-01 DIAGNOSIS — N921 Excessive and frequent menstruation with irregular cycle: Secondary | ICD-10-CM | POA: Diagnosis not present

## 2016-08-01 DIAGNOSIS — Z30432 Encounter for removal of intrauterine contraceptive device: Secondary | ICD-10-CM | POA: Diagnosis not present

## 2016-08-01 LAB — CBC WITH DIFFERENTIAL/PLATELET
Basophils Absolute: 0 cells/uL (ref 0–200)
Basophils Relative: 0 %
EOS PCT: 1 %
Eosinophils Absolute: 56 cells/uL (ref 15–500)
HCT: 38.6 % (ref 35.0–45.0)
HEMOGLOBIN: 12.9 g/dL (ref 11.7–15.5)
LYMPHS ABS: 2520 {cells}/uL (ref 850–3900)
Lymphocytes Relative: 45 %
MCH: 29.8 pg (ref 27.0–33.0)
MCHC: 33.4 g/dL (ref 32.0–36.0)
MCV: 89.1 fL (ref 80.0–100.0)
MPV: 10.3 fL (ref 7.5–12.5)
Monocytes Absolute: 560 cells/uL (ref 200–950)
Monocytes Relative: 10 %
NEUTROS ABS: 2464 {cells}/uL (ref 1500–7800)
NEUTROS PCT: 44 %
PLATELETS: 223 10*3/uL (ref 140–400)
RBC: 4.33 MIL/uL (ref 3.80–5.10)
RDW: 13.4 % (ref 11.0–15.0)
WBC: 5.6 10*3/uL (ref 3.8–10.8)

## 2016-08-01 MED ORDER — MEGESTROL ACETATE 40 MG PO TABS: 40.0000 mg | ORAL_TABLET | Freq: Two times a day (BID) | ORAL | 1 refills | Status: DC

## 2016-08-01 NOTE — Progress Notes (Signed)
   Patient is a 51 year old that was last seen in the office on September 29 where she had a Mirena IUD placed. Patient states that she had a Mirena IUD placed she continues to bleed. Prior to insertion of the IUD she had a negative urine pregnancy test. Patient prior to the IUD had been on the oral contraceptive pill for many years. She did have an [facility] 02/22/2016 with a value in the menopausal range with a value of 42.  Perimenopausal patient with continued bleeding with Mirena IUD was recommended that the IUD be removed today and is doing an endometrial biopsy and is to return for some hysterogram to rule out any intracavitary abnormality contributing to her persistent dysphagia bleeding. She has consented to that.  Exam: Abdomen: Soft nontender no rebound or guarding Pelvic: Bartholin urethra Skene was within normal limits Vagina some blood was present Cervix: No gross lesions on inspection IUD string was visualized it was grasp with a [place] clamp retrieved shown to the patient and discarded Uterus: Anteverted normal size shape and consistency nontender Adnexa: No palpable masses or tenderness Rectal exam: Not done  The cervix is then cleansed with Betadine solution and a sterile Pipelle was introduced into the uterine cavity. Uterus sounded to possibly 7-1/2-8 cm. Moderate amount of tissue was obtained and was submitted for histological evaluation.  Assessment/plan: Perimenopausal patient with continued breakthrough bleeding with Mirena IUD. IUD removed. Endometrial biopsy done result pending at time of this dictation. CBC will be done today to check her platelets as well as make sure she is not anemic. She will be prescribed Megace 40 mg take 1 by mouth twice a day for 10 days to stop her bleeding. She'll return back to the office in 1-2 weeks for sonohysterogram to rule out any intracavitary defects inside the uterus. She was informed that she needs to use barrier contraception while she's on  the Megace.

## 2016-08-01 NOTE — Addendum Note (Signed)
Addended by: Burnett Kanaris on: 08/01/2016 04:09 PM   Modules accepted: Orders

## 2016-08-01 NOTE — Patient Instructions (Signed)

## 2016-08-05 ENCOUNTER — Other Ambulatory Visit: Payer: Self-pay | Admitting: Gynecology

## 2016-08-05 DIAGNOSIS — N939 Abnormal uterine and vaginal bleeding, unspecified: Secondary | ICD-10-CM

## 2016-08-09 ENCOUNTER — Encounter: Payer: Self-pay | Admitting: *Deleted

## 2016-08-09 NOTE — Progress Notes (Signed)
Pt unable to do [facility] for financial reasons. Dr Moshe Salisbury reviewed chart and advised Since endo biopsy was benign and hgb normal and wiith him removing IUD he advised she can watch her symptoms for next 3-6 months and we then can follow up. KW CMA

## 2016-08-14 ENCOUNTER — Ambulatory Visit: Payer: BLUE CROSS/BLUE SHIELD | Admitting: Gynecology

## 2016-08-14 ENCOUNTER — Other Ambulatory Visit: Payer: BLUE CROSS/BLUE SHIELD

## 2016-11-13 ENCOUNTER — Encounter: Payer: Self-pay | Admitting: Gynecology

## 2017-01-09 ENCOUNTER — Ambulatory Visit: Payer: BLUE CROSS/BLUE SHIELD | Admitting: Internal Medicine

## 2017-01-09 DIAGNOSIS — Z0289 Encounter for other administrative examinations: Secondary | ICD-10-CM

## 2017-03-27 ENCOUNTER — Ambulatory Visit (INDEPENDENT_AMBULATORY_CARE_PROVIDER_SITE_OTHER): Payer: BLUE CROSS/BLUE SHIELD

## 2017-03-27 ENCOUNTER — Ambulatory Visit (INDEPENDENT_AMBULATORY_CARE_PROVIDER_SITE_OTHER): Payer: BLUE CROSS/BLUE SHIELD | Admitting: Family Medicine

## 2017-03-27 ENCOUNTER — Encounter: Payer: Self-pay | Admitting: Family Medicine

## 2017-03-27 VITALS — BP 122/88 | HR 68 | Temp 98.1°F | Resp 16 | Ht 63.0 in | Wt 189.0 lb

## 2017-03-27 DIAGNOSIS — K219 Gastro-esophageal reflux disease without esophagitis: Secondary | ICD-10-CM | POA: Diagnosis not present

## 2017-03-27 DIAGNOSIS — R079 Chest pain, unspecified: Secondary | ICD-10-CM | POA: Diagnosis not present

## 2017-03-27 MED ORDER — GI COCKTAIL ~~LOC~~
30.0000 mL | Freq: Once | ORAL | Status: AC
  Administered 2017-03-27: 30 mL via ORAL

## 2017-03-27 MED ORDER — OMEPRAZOLE 20 MG PO CPDR: 20.0000 mg | DELAYED_RELEASE_CAPSULE | Freq: Two times a day (BID) | ORAL | 2 refills | Status: DC

## 2017-03-27 NOTE — Patient Instructions (Addendum)
IF you received an x-ray today, you will receive an invoice from [facility]. Please contact [place] [facility] at [phone] with questions or concerns regarding your invoice.   IF you received labwork today, you will receive an invoice from [place]. Please contact [facility] at [phone] with questions or concerns regarding your invoice.   Our billing staff will not be able to assist you with questions regarding bills from these companies.  You will be contacted with the lab results as soon as they are available. The fastest way to get your results is to activate your My Chart account. Instructions are located on the last page of this paperwork. If you have not heard from [place] regarding the results in 2 weeks, please contact this office.      Food Choices for Gastroesophageal Reflux Disease, Adult When you have gastroesophageal reflux disease (GERD), the foods you eat and your eating habits are very important. Choosing the right foods can help ease the discomfort of GERD. Consider working with a diet and nutrition specialist (dietitian) to help you make healthy food choices. What general guidelines should I follow? Eating plan  Choose healthy foods low in fat, such as fruits, vegetables, whole grains, low-fat dairy products, and lean meat, fish, and poultry.  Eat frequent, small meals instead of three large meals each day. Eat your meals slowly, in a relaxed setting. Avoid bending over or lying down until 2-3 hours after eating.  Limit high-fat foods such as fatty meats or fried foods.  Limit your intake of oils, butter, and shortening to less than 8 teaspoons each day.  Avoid the following: ? Foods that cause symptoms. These may be different for different people. Keep a food diary to keep track of foods that cause symptoms. ? Alcohol. ? Drinking large amounts of liquid with meals. ? Eating meals during the 2-3 hours before bed.  Cook foods using methods other  than frying. This may include baking, grilling, or broiling. Lifestyle   Maintain a healthy weight. Ask your health care provider what weight is healthy for you. If you need to lose weight, work with your health care provider to do so safely.  Exercise for at least 30 minutes on 5 or more days each week, or as told by your health care provider.  Avoid wearing clothes that fit tightly around your waist and chest.  Do not use any products that contain nicotine or tobacco, such as cigarettes and e-cigarettes. If you need help quitting, ask your health care provider.  Sleep with the head of your bed raised. Use a wedge under the mattress or blocks under the bed frame to raise the head of the bed. What foods are not recommended? The items listed may not be a complete list. Talk with your dietitian about what dietary choices are best for you. Grains Pastries or quick breads with added fat. [place] toast. Vegetables Deep fried vegetables. [place] fries. Any vegetables prepared with added fat. Any vegetables that cause symptoms. For some people this may include tomatoes and tomato products, chili peppers, onions and garlic, and horseradish. Fruits Any fruits prepared with added fat. Any fruits that cause symptoms. For some people this may include citrus fruits, such as oranges, grapefruit, pineapple, and lemons. Meats and other protein foods High-fat meats, such as fatty beef or pork, hot dogs, ribs, ham, sausage, salami and bacon. Fried meat or protein, including fried fish and fried chicken. Nuts and nut butters. Dairy Whole milk and chocolate milk. Sour  cream. Cream. Ice cream. Cream cheese. Milk shakes. Beverages Coffee and tea, with or without caffeine. Carbonated beverages. Sodas. Energy drinks. Fruit juice made with acidic fruits (such as orange or grapefruit). Tomato juice. Alcoholic drinks. Fats and oils Butter. Margarine. Shortening. Ghee. Sweets and desserts Chocolate and cocoa.  Donuts. Seasoning and other foods Pepper. Peppermint and spearmint. Any condiments, herbs, or seasonings that cause symptoms. For some people, this may include curry, hot sauce, or vinegar-based salad dressings. Summary  When you have gastroesophageal reflux disease (GERD), food and lifestyle choices are very important to help ease the discomfort of GERD.  Eat frequent, small meals instead of three large meals each day. Eat your meals slowly, in a relaxed setting. Avoid bending over or lying down until 2-3 hours after eating.  Limit high-fat foods such as fatty meat or fried foods. This information is not intended to replace advice given to you by your health care provider. Make sure you discuss any questions you have with your health care provider. Document Released: 06/17/2005 Document Revised: 06/18/2016 Document Reviewed: 06/18/2016 Elsevier Interactive Patient Education  2017 [employer].

## 2017-03-27 NOTE — Progress Notes (Signed)
9/27/20185:30 PM  Angel Bennett September 24, 1965, 51 y.o. female [id number]  Chief Complaint  Patient presents with  . Chest Pain    pt states she has some wheezing in her chest at night/ x 3 days  . Sinusitis    x 3 days  . Back Pain  . Generalized Body Aches    HPI:   Patient is a 51 y.o. female who presents today for about 3 days of constant chest tightness that radiates to her back and left shoulder, made worse by activity or lying down. Felt she was wheezing last night. She is worried she might have pneumonia as she has had this before. She denies any fever, chills, cough or SOB. She states that her pain has made it difficult to sleep on her left side. She reports worsening of heartburn over recent days. She denies any leg edema, palpitations, dizziness. She has not taken anything for it. She has never had anything like this before. She works doing heavy lifting. She does not have HTN, does not smoke, is not a diabetic. She is obese.  Depression screen [facility] 2/9 07/20/2016 02/12/2016 11/22/2015  Decreased Interest 0 0 0  Down, Depressed, Hopeless 1 0 0  PHQ - 2 Score 1 0 0    No Known Allergies  No current outpatient prescriptions on file prior to visit.   No current facility-administered medications on file prior to visit.     Past Medical History:  Diagnosis Date  . Abdominal pain    Diffuse mild  . C. difficile colitis [id number]   Treated w PO Metronidazole  . Chest pain 05/20/2015  . Constipation, chronic   . GERD (gastroesophageal reflux disease)   . Hx of adenomatous polyp of colon 10/27/2014  . Hyperthyroidism 05/20/2015  . Palpitations 05/20/2015  . Ringing in ears    both ears    Past Surgical History:  Procedure Laterality Date  . CESAREAN SECTION  1990  . CHOLECYSTECTOMY  03/13/2011   lap single site  . COLONOSCOPY  05/15/2011   Procedure: COLONOSCOPY;  Surgeon: Landry Dyke, MD;  Location: [facility];  Service: Endoscopy;  Laterality: N/A;  .  ESOPHAGOGASTRODUODENOSCOPY  05/15/2011   Procedure: ESOPHAGOGASTRODUODENOSCOPY (EGD);  Surgeon: Landry Dyke, MD;  Location: [facility];  Service: Endoscopy;  Laterality: N/A;  . [place]    Social History  Substance Use Topics  . Smoking status: Never Smoker  . Smokeless tobacco: Never Used  . Alcohol use No    Family History  Problem Relation Age of Onset  . Diabetes Mother 43  . Hypertension Mother   . Hypertension Father     Review of Systems  Constitutional: Negative for chills, diaphoresis and fever.  HENT: Negative for congestion, ear pain and sore throat.   Eyes: Negative for blurred vision.  Respiratory: Positive for wheezing. Negative for cough and shortness of breath.   Cardiovascular: Positive for chest pain. Negative for palpitations and leg swelling.  Gastrointestinal: Positive for heartburn. Negative for abdominal pain, nausea and vomiting.  Neurological: Negative for dizziness.     OBJECTIVE:  Blood pressure 122/88, pulse 68, temperature 98.1 F (36.7 C), temperature source Oral, resp. rate 16, height 5\' 3"  (1.6 m), weight 189 lb (85.7 kg), SpO2 98 %.  Physical Exam  Constitutional: She is oriented to person, place, and time and well-developed, well-nourished, and in no distress.  HENT:  Head: Normocephalic and atraumatic.  Mouth/Throat: Oropharynx is clear and moist. No oropharyngeal exudate.  Eyes: Pupils are equal, round, and reactive to light. EOM are normal. No scleral icterus.  Neck: Neck supple. No JVD present. Carotid bruit is not present. No thyromegaly present.  Cardiovascular: Normal rate, regular rhythm, normal heart sounds and intact distal pulses.  Exam reveals no gallop and no friction rub.   No murmur heard. Pulmonary/Chest: Effort normal and breath sounds normal. She has no wheezes. She has no rales. She exhibits tenderness.  Musculoskeletal: She exhibits no edema.  Neurological: She is alert and oriented to person,  place, and time. Gait normal.  Skin: Skin is warm and dry.      Dg Chest 2 View  Result Date: 03/27/2017 CLINICAL DATA:  Increasing chest pain shortness of breath over 2 weeks, initial encounter EXAM: CHEST  2 VIEW COMPARISON:  09/13/2014 FINDINGS: The heart size and mediastinal contours are within normal limits. Both lungs are clear. The visualized skeletal structures are unremarkable. IMPRESSION: No active cardiopulmonary disease. Electronically Signed   By: Inez Catalina M.D.   On: 03/27/2017 16:15   EKG: NSR, HR 63, normal QT, no q waves, no ST changes  ASSESSMENT and PLAN  1. Chest pain, unspecified type EKG and CXR unremarkable. Chest pain improved with GI cocktail. Therefore component of GERD but there is also a component of MSK pain given exam. Discussed conservative measures  - EKG 12-Lead  - DG Chest 2 View; Future - gi cocktail (Maalox,Lidocaine,Donnatal); Take 30 mLs by mouth once.  2. Gastroesophageal reflux disease, esophagitis presence not specified Discussed dietary modifications.  - omeprazole (PRILOSEC) 20 MG capsule; Take 1 capsule (20 mg total) by mouth 2 (two) times daily before a meal.  Return in about 2 weeks (around 04/10/2017).    Rutherford Guys, MD Primary Care at [place], [place] [address] Ph.  [phone] Fax [phone]

## 2017-04-08 ENCOUNTER — Telehealth: Payer: Self-pay | Admitting: Family Medicine

## 2017-04-08 NOTE — Telephone Encounter (Signed)
Pt is needing to talk with Angel Bennett regarding the time frame of when she was to be seen again-two weeks from last appt or the 05/09/17 appt she has scheduled   Best [phone]

## 2017-04-10 NOTE — Telephone Encounter (Signed)
Pt advised to come in 2 weeks after her prev appointment. Transferred call to scheduler.

## 2017-04-11 ENCOUNTER — Encounter: Payer: Self-pay | Admitting: Family Medicine

## 2017-04-11 ENCOUNTER — Ambulatory Visit (INDEPENDENT_AMBULATORY_CARE_PROVIDER_SITE_OTHER): Payer: BLUE CROSS/BLUE SHIELD | Admitting: Family Medicine

## 2017-04-11 VITALS — BP 130/80 | HR 69 | Temp 97.8°F | Resp 18 | Ht 63.0 in | Wt 187.6 lb

## 2017-04-11 DIAGNOSIS — J302 Other seasonal allergic rhinitis: Secondary | ICD-10-CM | POA: Diagnosis not present

## 2017-04-11 DIAGNOSIS — R079 Chest pain, unspecified: Secondary | ICD-10-CM

## 2017-04-11 DIAGNOSIS — K219 Gastro-esophageal reflux disease without esophagitis: Secondary | ICD-10-CM

## 2017-04-11 MED ORDER — BENZONATATE 200 MG PO CAPS: 200.0000 mg | ORAL_CAPSULE | Freq: Two times a day (BID) | ORAL | 0 refills | Status: DC | PRN

## 2017-04-11 MED ORDER — FLUTICASONE PROPIONATE 50 MCG/ACT NA SUSP: 2.0000 | Freq: Every day | NASAL | 6 refills | Status: DC

## 2017-04-11 NOTE — Progress Notes (Signed)
10/12/20184:41 PM  Angel Bennett Apr 06, 1966, 51 y.o. female [id number]  Chief Complaint  Patient presents with  . Follow-up    on chest     HPI:   Patient is a 51 y.o. female who presents today for followup.  She reports heartburn is much better with dietary changes, takes PPI prn.   She has also recently having nasal congestion, cough, sneezing, States this happens every fall, thinks it is allergies would like treatment.   She reports continued chest pain, mid sternal, radiates to the back,  happens now only with exertion. If she just rests she has no pain, but as soon as she starts to clean or do some heavy lifting ,etc she starts getting pain, she denies any nausea, sob with this pain. She became diaphoretic once. EKG last visit was unremarkable. Pain resolves with rest.   Depression screen [facility] 2/9 07/20/2016 02/12/2016 11/22/2015  Decreased Interest 0 0 0  Down, Depressed, Hopeless 1 0 0  PHQ - 2 Score 1 0 0    No Known Allergies  Prior to Admission medications   Medication Sig Start Date End Date Taking? Authorizing Provider  omeprazole (PRILOSEC) 20 MG capsule Take 1 capsule (20 mg total) by mouth 2 (two) times daily before a meal. Patient not taking: Reported on 04/11/2017 03/27/17   Rutherford Guys, MD    Past Medical History:  Diagnosis Date  . Abdominal pain    Diffuse mild  . C. difficile colitis [id number]   Treated w PO Metronidazole  . Chest pain 05/20/2015  . Constipation, chronic   . GERD (gastroesophageal reflux disease)   . Hx of adenomatous polyp of colon 10/27/2014  . Hyperthyroidism 05/20/2015  . Palpitations 05/20/2015  . Ringing in ears    both ears    Past Surgical History:  Procedure Laterality Date  . CESAREAN SECTION  1990  . CHOLECYSTECTOMY  03/13/2011   lap single site  . COLONOSCOPY  05/15/2011   Procedure: COLONOSCOPY;  Surgeon: Landry Dyke, MD;  Location: [facility];  Service: Endoscopy;  Laterality: N/A;  .  ESOPHAGOGASTRODUODENOSCOPY  05/15/2011   Procedure: ESOPHAGOGASTRODUODENOSCOPY (EGD);  Surgeon: Landry Dyke, MD;  Location: [facility];  Service: Endoscopy;  Laterality: N/A;  . [place]    Social History  Substance Use Topics  . Smoking status: Never Smoker  . Smokeless tobacco: Never Used  . Alcohol use No    Family History  Problem Relation Age of Onset  . Diabetes Mother 37  . Hypertension Mother   . Hypertension Father     Review of Systems  Constitutional: Negative for chills and fever.  HENT: Positive for congestion. Negative for ear pain, sinus pain and sore throat.   Respiratory: Negative for cough, sputum production, shortness of breath and wheezing.   Cardiovascular: Positive for chest pain and PND. Negative for palpitations, orthopnea and leg swelling.  Gastrointestinal: Negative for abdominal pain, nausea and vomiting.  Endo/Heme/Allergies: Positive for environmental allergies.     OBJECTIVE:  Blood pressure 130/80, pulse 69, temperature 97.8 F (36.6 C), temperature source Oral, resp. rate 18, height 5\' 3"  (1.6 m), weight 187 lb 9.6 oz (85.1 kg), SpO2 96 %.  Physical Exam  Constitutional: She is oriented to person, place, and time and well-developed, well-nourished, and in no distress.  HENT:  Head: Normocephalic and atraumatic.  Mouth/Throat: Oropharynx is clear and moist. No oropharyngeal exudate.  Eyes: Pupils are equal, round, and reactive to light. EOM are  normal. No scleral icterus.  Neck: Neck supple.  Cardiovascular: Normal rate, regular rhythm and normal heart sounds.  Exam reveals no gallop and no friction rub.   No murmur heard. Pulmonary/Chest: Effort normal and breath sounds normal. She has no wheezes. She has no rales.  Musculoskeletal: She exhibits no edema.  Neurological: She is alert and oriented to person, place, and time. Gait normal.  Skin: Skin is warm and dry.     ASSESSMENT and PLAN  1. Chest pain,  unspecified type ER precautions given.  - Ambulatory referral to Cardiology  2. Gastroesophageal reflux disease, esophagitis presence not specified Much improved, Cont with LFM and prn use of PPI.   3. Seasonal allergies Discussed new meds r/se/b.  - fluticasone (FLONASE) 50 MCG/ACT nasal spray; Place 2 sprays into both nostrils daily. - benzonatate (TESSALON) 200 MG capsule; Take 1 capsule (200 mg total) by mouth 2 (two) times daily as needed for cough.  Return in about 4 weeks (around 05/09/2017) for after cards.    Rutherford Guys, MD Primary Care at [place], [place] [address] Ph.  [phone] Fax [phone]

## 2017-04-11 NOTE — Patient Instructions (Signed)
     IF you received an x-ray today, you will receive an invoice from [facility]. Please contact  Radiology at [phone] with questions or concerns regarding your invoice.   IF you received labwork today, you will receive an invoice from [facility]. Please contact [facility] at [phone] with questions or concerns regarding your invoice.   Our billing staff will not be able to assist you with questions regarding bills from these companies.  You will be contacted with the lab results as soon as they are available. The fastest way to get your results is to activate your My Chart account. Instructions are located on the last page of this paperwork. If you have not heard from us regarding the results in 2 weeks, please contact this office.     

## 2017-04-25 ENCOUNTER — Telehealth: Payer: Self-pay | Admitting: Family Medicine

## 2017-04-25 NOTE — Telephone Encounter (Signed)
Dr Wynonia Lawman Cardiologist office called tp let us know that pt called to cancell her appointment with them on the 30th of Oct

## 2017-04-26 NOTE — Telephone Encounter (Signed)
Please see note below. 

## 2017-04-29 NOTE — Telephone Encounter (Signed)
noted 

## 2017-05-09 ENCOUNTER — Ambulatory Visit: Payer: BLUE CROSS/BLUE SHIELD | Admitting: Family Medicine

## 2017-05-27 ENCOUNTER — Ambulatory Visit (INDEPENDENT_AMBULATORY_CARE_PROVIDER_SITE_OTHER): Payer: BLUE CROSS/BLUE SHIELD | Admitting: Family Medicine

## 2017-05-27 ENCOUNTER — Encounter: Payer: Self-pay | Admitting: Family Medicine

## 2017-05-27 ENCOUNTER — Other Ambulatory Visit: Payer: Self-pay

## 2017-05-27 VITALS — BP 130/80 | HR 80 | Temp 97.5°F | Wt 186.0 lb

## 2017-05-27 DIAGNOSIS — R0683 Snoring: Secondary | ICD-10-CM

## 2017-05-27 DIAGNOSIS — E0590 Thyrotoxicosis, unspecified without thyrotoxic crisis or storm: Secondary | ICD-10-CM

## 2017-05-27 DIAGNOSIS — I071 Rheumatic tricuspid insufficiency: Secondary | ICD-10-CM | POA: Insufficient documentation

## 2017-05-27 DIAGNOSIS — R0609 Other forms of dyspnea: Secondary | ICD-10-CM

## 2017-05-27 DIAGNOSIS — Z0000 Encounter for general adult medical examination without abnormal findings: Secondary | ICD-10-CM

## 2017-05-27 NOTE — Patient Instructions (Signed)
     IF you received an x-ray today, you will receive an invoice from [facility]. Please contact [place] [facility] at [phone] with questions or concerns regarding your invoice.   IF you received labwork today, you will receive an invoice from [facility]. Please contact [facility] at [phone] with questions or concerns regarding your invoice.   Our billing staff will not be able to assist you with questions regarding bills from these companies.  You will be contacted with the lab results as soon as they are available. The fastest way to get your results is to activate your My Chart account. Instructions are located on the last page of this paperwork. If you have not heard from us regarding the results in 2 weeks, please contact this office.     

## 2017-05-28 LAB — COMPREHENSIVE METABOLIC PANEL
ALT: 13 IU/L (ref 0–32)
AST: 19 IU/L (ref 0–40)
Albumin/Globulin Ratio: 1.3 (ref 1.2–2.2)
Albumin: 4.4 g/dL (ref 3.5–5.5)
Alkaline Phosphatase: 131 IU/L — ABNORMAL HIGH (ref 39–117)
BUN/Creatinine Ratio: 14 (ref 9–23)
BUN: 9 mg/dL (ref 6–24)
Bilirubin Total: 0.2 mg/dL (ref 0.0–1.2)
CO2: 26 mmol/L (ref 20–29)
Calcium: 9.7 mg/dL (ref 8.7–10.2)
Chloride: 101 mmol/L (ref 96–106)
Creatinine, Ser: 0.64 mg/dL (ref 0.57–1.00)
GFR calc Af Amer: 120 mL/min/{1.73_m2} (ref >59)
GFR calc non Af Amer: 104 mL/min/{1.73_m2} (ref >59)
Globulin, Total: 3.5 g/dL (ref 1.5–4.5)
Glucose: 93 mg/dL (ref 65–99)
Potassium: 4.6 mmol/L (ref 3.5–5.2)
Sodium: 141 mmol/L (ref 134–144)
Total Protein: 7.9 g/dL (ref 6.0–8.5)

## 2017-05-28 LAB — CBC WITH DIFFERENTIAL/PLATELET
Basophils Absolute: 0 10*3/uL (ref 0.0–0.2)
Basos: 0 %
EOS (ABSOLUTE): 0.1 10*3/uL (ref 0.0–0.4)
Eos: 1 %
Hematocrit: 40.2 % (ref 34.0–46.6)
Hemoglobin: 13.8 g/dL (ref 11.1–15.9)
Immature Grans (Abs): 0 10*3/uL (ref 0.0–0.1)
Immature Granulocytes: 0 %
Lymphocytes Absolute: 2.5 10*3/uL (ref 0.7–3.1)
Lymphs: 51 %
MCH: 30.1 pg (ref 26.6–33.0)
MCHC: 34.3 g/dL (ref 31.5–35.7)
MCV: 88 fL (ref 79–97)
Monocytes Absolute: 0.4 10*3/uL (ref 0.1–0.9)
Monocytes: 8 %
Neutrophils Absolute: 2 10*3/uL (ref 1.4–7.0)
Neutrophils: 40 %
Platelets: 251 10*3/uL (ref 150–379)
RBC: 4.58 x10E6/uL (ref 3.77–5.28)
RDW: 13.7 % (ref 12.3–15.4)
WBC: 4.9 10*3/uL (ref 3.4–10.8)

## 2017-05-28 LAB — LIPID PANEL
Chol/HDL Ratio: 2.7 ratio (ref 0.0–4.4)
Cholesterol, Total: 143 mg/dL (ref 100–199)
HDL: 53 mg/dL (ref >39)
LDL Calculated: 75 mg/dL (ref 0–99)
Triglycerides: 75 mg/dL (ref 0–149)
VLDL Cholesterol Cal: 15 mg/dL (ref 5–40)

## 2017-05-28 LAB — VITAMIN D 25 HYDROXY (VIT D DEFICIENCY, FRACTURES): Vit D, 25-Hydroxy: 28.7 ng/mL — ABNORMAL LOW (ref 30.0–100.0)

## 2017-05-28 LAB — T4, FREE: Free T4: 1.26 ng/dL (ref 0.82–1.77)

## 2017-05-28 LAB — TSH: TSH: 0.367 u[IU]/mL — ABNORMAL LOW (ref 0.450–4.500)

## 2017-05-29 ENCOUNTER — Encounter: Payer: Self-pay | Admitting: Family Medicine

## 2017-05-29 NOTE — Progress Notes (Signed)
11/29/20182:06 PM  Angel Bennett 11-01-1965, 51 y.o. female [id number]  Chief Complaint  Patient presents with  . Annual Exam  . Gynecologic Exam    HPI:   Patient is a 51 y.o. female who presents today for CPE.  Cervical Cancer Screening: pap with HPV negative 2017 Breast Cancer Screening: mammo, 2017, normal Colorectal Cancer Screening: 2016, positive for polyps, repeat in 2021 Bone Density Testing: N/A Seasonal Influenza Vaccination: 03/2017 Td/Tdap Vaccination: 10/016 Pneumococcal Vaccination: N/A Zoster Vaccination: N/A Frequency of Dental evaluation: Q6 months Frequency of Eye evaluation: does not wear glasses  Depression screen [facility] 2/9 05/27/2017 07/20/2016 02/12/2016  Decreased Interest 0 0 0  Down, Depressed, Hopeless 0 1 0  PHQ - 2 Score 0 1 0    No Known Allergies  Prior to Admission medications   Medication Sig Start Date End Date Taking? Authorizing Provider  benzonatate (TESSALON) 200 MG capsule Take 1 capsule (200 mg total) by mouth 2 (two) times daily as needed for cough. 04/11/17  Yes Rutherford Guys, MD  fluticasone (FLONASE) 50 MCG/ACT nasal spray Place 2 sprays into both nostrils daily. 04/11/17  Yes Rutherford Guys, MD  omeprazole (PRILOSEC) 20 MG capsule Take 1 capsule (20 mg total) by mouth 2 (two) times daily before a meal. 03/27/17  Yes Rutherford Guys, MD    Past Medical History:  Diagnosis Date  . Abdominal pain    Diffuse mild  . C. difficile colitis [id number]   Treated w PO Metronidazole  . Chest pain 05/20/2015  . Constipation, chronic   . GERD (gastroesophageal reflux disease)   . Hx of adenomatous polyp of colon 10/27/2014  . Hyperthyroidism 05/20/2015  . Palpitations 05/20/2015  . Ringing in ears    both ears    Past Surgical History:  Procedure Laterality Date  . CESAREAN SECTION  1990  . CHOLECYSTECTOMY  03/13/2011   lap single site  . COLONOSCOPY  05/15/2011   Procedure: COLONOSCOPY;  Surgeon: Landry Dyke, MD;   Location: [facility];  Service: Endoscopy;  Laterality: N/A;  . ESOPHAGOGASTRODUODENOSCOPY  05/15/2011   Procedure: ESOPHAGOGASTRODUODENOSCOPY (EGD);  Surgeon: Landry Dyke, MD;  Location: [facility];  Service: Endoscopy;  Laterality: N/A;  . HEMORRHOID SURGERY  1990    Social History   Tobacco Use  . Smoking status: Never Smoker  . Smokeless tobacco: Never Used  Substance Use Topics  . Alcohol use: No    Family History  Problem Relation Age of Onset  . Diabetes Mother 1  . Hypertension Mother   . Hypertension Father     Review of Systems  Constitutional: Negative for chills, fever and weight loss.  HENT: Negative for congestion, hearing loss, sore throat and tinnitus.   Eyes: Negative for blurred vision and double vision.  Respiratory: Negative for cough and shortness of breath.   Cardiovascular: Negative for chest pain, palpitations and leg swelling.       Positive DOE and chest heaviness, patient had mod TR in echo 2016, has been referred to cards  Gastrointestinal: Negative for abdominal pain, blood in stool, constipation, diarrhea, melena, nausea and vomiting.  Genitourinary: Negative for dysuria and hematuria.       Neg breast lumps or nipple discharge Neg vaginal discharge, pelvic pain, dyspareunia, abnormal vaginal bleeding  Musculoskeletal: Negative for myalgias.  Neurological: Positive for headaches. Negative for dizziness, sensory change, speech change and focal weakness.       Snoring, husband states that she stops breathing  Endo/Heme/Allergies:  Negative for polydipsia.  Psychiatric/Behavioral: Negative for depression and memory loss. The patient is not nervous/anxious.     OBJECTIVE:  Blood pressure 130/80, pulse 80, temperature (!) 97.5 F (36.4 C), temperature source Oral, weight 186 lb (84.4 kg), last menstrual period 02/19/2017, SpO2 97 %.  Physical Exam  Constitutional: She is oriented to person, place, and time and well-developed,  well-nourished, and in no distress.  HENT:  Head: Normocephalic and atraumatic.  Right Ear: Hearing, tympanic membrane, external ear and ear canal normal.  Left Ear: Hearing, tympanic membrane, external ear and ear canal normal.  Mouth/Throat: Oropharynx is clear and moist.  Eyes: EOM are normal. Pupils are equal, round, and reactive to light.  Neck: Neck supple. No thyromegaly present.  Cardiovascular: Normal rate, regular rhythm, normal heart sounds and intact distal pulses. Exam reveals no gallop and no friction rub.  No murmur heard. Pulmonary/Chest: Effort normal and breath sounds normal. She has no wheezes. She has no rales. Right breast exhibits no inverted nipple, no mass, no nipple discharge, no skin change and no tenderness. Left breast exhibits no inverted nipple, no mass, no nipple discharge, no skin change and no tenderness.  Abdominal: Soft. Bowel sounds are normal. She exhibits no distension and no mass. There is no tenderness.  Musculoskeletal: Normal range of motion. She exhibits no edema.  Lymphadenopathy:    She has no cervical adenopathy.    She has no axillary adenopathy.       Right: No supraclavicular adenopathy present.       Left: No supraclavicular adenopathy present.  Neurological: She is alert and oriented to person, place, and time. She has normal reflexes. Gait normal.  Skin: Skin is warm and dry.  Psychiatric: Mood and affect normal.  Nursing note and vitals reviewed.    ASSESSMENT and PLAN 1. Annual physical exam Routine HCM labs ordered. HCM reviewed/discussed. Anticipatory guidance regarding healthy weight, lifestyle and choices given.    - CBC with Differential/Platelet - Comprehensive metabolic panel - Lipid panel - VITAMIN D 25 Hydroxy (Vit-D Deficiency, Fractures)  2. Snoring - Ambulatory referral to Neurology  3. Subclinical hyperthyroidism - TSH - T4, free  4. Moderate tricuspid regurgitation  Patient reports recent appointment with  cards, no records available to review at this time  5. DOE (dyspnea on exertion) See above  No Follow-up on file.    Rutherford Guys, MD [facility] [place], [place] [address] Ph.  [phone] Fax [phone]

## 2017-07-22 ENCOUNTER — Other Ambulatory Visit: Payer: Self-pay | Admitting: Family Medicine

## 2017-07-22 DIAGNOSIS — Z1231 Encounter for screening mammogram for malignant neoplasm of breast: Secondary | ICD-10-CM

## 2017-08-27 ENCOUNTER — Encounter (INDEPENDENT_AMBULATORY_CARE_PROVIDER_SITE_OTHER): Payer: Self-pay

## 2017-08-27 ENCOUNTER — Ambulatory Visit (INDEPENDENT_AMBULATORY_CARE_PROVIDER_SITE_OTHER): Payer: BLUE CROSS/BLUE SHIELD | Admitting: Neurology

## 2017-08-27 ENCOUNTER — Encounter: Payer: Self-pay | Admitting: Neurology

## 2017-08-27 VITALS — BP 123/74 | HR 59 | Ht 64.0 in | Wt 185.0 lb

## 2017-08-27 DIAGNOSIS — G4763 Sleep related bruxism: Secondary | ICD-10-CM

## 2017-08-27 DIAGNOSIS — R51 Headache: Secondary | ICD-10-CM | POA: Diagnosis not present

## 2017-08-27 DIAGNOSIS — Z8701 Personal history of pneumonia (recurrent): Secondary | ICD-10-CM | POA: Diagnosis not present

## 2017-08-27 DIAGNOSIS — R0681 Apnea, not elsewhere classified: Secondary | ICD-10-CM | POA: Diagnosis not present

## 2017-08-27 DIAGNOSIS — R519 Headache, unspecified: Secondary | ICD-10-CM

## 2017-08-27 DIAGNOSIS — E669 Obesity, unspecified: Secondary | ICD-10-CM | POA: Diagnosis not present

## 2017-08-27 DIAGNOSIS — R351 Nocturia: Secondary | ICD-10-CM | POA: Diagnosis not present

## 2017-08-27 NOTE — Patient Instructions (Signed)

## 2017-08-27 NOTE — Progress Notes (Signed)
Subjective:    Patient ID: Angel Bennett is a 52 y.o. female.  HPI     History:   Dear Dr. Pamella Pert,   I saw your patient, Angel Bennett, upon your kind request in my neurologic clinic today for initial consultation of her sleep disorder, in particular, concern for underlying obstructive sleep apnea. The patient is accompanied by her husband today. As you know, Angel Bennett is a 52 year old right-handed woman with an underlying medical history of reflux disease, hyperthyroidism, palpitations, history of C. difficile colitis, and obesity, who reports snoring and non restorative sleep, multiple night time awakenings, nocturia, which is about 3 times per night, also witnessed apneas per husband. Symptoms are ongoing for about 2-3 years, she has gained some weight. She denies frank excessive daytime somnolence. I reviewed your office note from 05/27/2017. Her Epworth sleepiness score is 0 out of 24, fatigue score is 57 out of 63. She lives at home with her husband and her son and daughter. She has a total of 5 children. She is a nonsmoker and does not utilize alcohol, drinks caffeine about 1-2 servings per day on average. Her 3 older children are 17, 65, and 79, her 56 year old son lives at home as well as her 63 year old daughter. Patient reports that she has sleep disruption, does not wake up rested, is not sleeping well, wakes up choking and short of breath, her husband has witnessed pauses in her breathing at times. Bedtime varies but can be as early as 7 PM or 8. Rise time is around 5 AM. She works Sales executive parts. She has allergy symptoms. She grinds her teeth at night and has used an over-the-counter bite guard inconsistently. She is not aware of any family history of obstructive sleep apnea, most of her family is in [place]. She has woken up with a headache which feels like a pressure sensation. She has nasal allergy symptoms and sinus issues, has had pneumonia a few times in her lifetime.  Her Past  Medical History Is Significant For: Past Medical History:  Diagnosis Date  . Abdominal pain    Diffuse mild  . C. difficile colitis [id number]   Treated w PO Metronidazole  . Chest pain 05/20/2015  . Constipation, chronic   . GERD (gastroesophageal reflux disease)   . Hx of adenomatous polyp of colon 10/27/2014  . Hyperthyroidism 05/20/2015  . Palpitations 05/20/2015  . Ringing in ears    both ears    Her Past Surgical History Is Significant For: Past Surgical History:  Procedure Laterality Date  . CESAREAN SECTION  1990  . CHOLECYSTECTOMY  03/13/2011   lap single site  . COLONOSCOPY  05/15/2011   Procedure: COLONOSCOPY;  Surgeon: Landry Dyke, MD;  Location: [facility];  Service: Endoscopy;  Laterality: N/A;  . ESOPHAGOGASTRODUODENOSCOPY  05/15/2011   Procedure: ESOPHAGOGASTRODUODENOSCOPY (EGD);  Surgeon: Landry Dyke, MD;  Location: [facility];  Service: Endoscopy;  Laterality: N/A;  . [place]    Her Family History Is Significant For: Family History  Problem Relation Age of Onset  . Diabetes Mother 61  . Hypertension Mother   . Hypertension Father     Her Social History Is Significant For: Social History   Socioeconomic History  . Marital status: Married    Spouse name: None  . Number of children: None  . Years of education: None  . Highest education level: None  Social Needs  . Financial resource strain: None  . Food insecurity - worry:  None  . Food insecurity - inability: None  . Transportation needs - medical: None  . Transportation needs - non-medical: None  Occupational History  . None  Tobacco Use  . Smoking status: Never Smoker  . Smokeless tobacco: Never Used  Substance and Sexual Activity  . Alcohol use: No  . Drug use: No  . Sexual activity: Yes    Birth control/protection: None  Other Topics Concern  . None  Social History Narrative   She is married she has 4 sons and one daughter. She is an Agricultural consultant of  automobiles. No caffeine or alcohol reported. Updated 10/26/2014         Epworth Sleepiness Scale = 9 (as of 05/19/15)    Her Allergies Are:  No Known Allergies:   Her Current Medications Are:  Outpatient Encounter Medications as of 08/27/2017  Medication Sig  . benzonatate (TESSALON) 200 MG capsule Take 1 capsule (200 mg total) by mouth 2 (two) times daily as needed for cough.  . fluticasone (FLONASE) 50 MCG/ACT nasal spray Place 2 sprays into both nostrils daily.  Marland Kitchen omeprazole (PRILOSEC) 20 MG capsule Take 1 capsule (20 mg total) by mouth 2 (two) times daily before a meal.   No facility-administered encounter medications on file as of 08/27/2017.   :  Review of Systems:  Out of a complete 14 point review of systems, all are reviewed and negative with the exception of these symptoms as listed below: Review of Systems  Neurological:       Pt presents today to discuss her sleep. Pt has never had a sleep study but does endorse snoring.  Epworth Sleepiness Scale 0= would never doze 1= slight chance of dozing 2= moderate chance of dozing 3= high chance of dozing  Sitting and reading: 0 Watching TV: 0 Sitting inactive in a public place (ex. Theater or meeting): 0 As a passenger in a car for an hour without a break: 0 Lying down to rest in the afternoon: 0 Sitting and talking to someone: 0 Sitting quietly after lunch (no alcohol): 0 In a car, while stopped in traffic: 0 Total: 0     Objective:  Neurological Exam  Physical Exam Physical Examination:   Vitals:   08/27/17 1536  BP: 123/74  Pulse: (!) 59   General Examination: The patient is a very pleasant 52 y.o. female in no acute distress. She appears well-developed and well-nourished and well groomed.   HEENT: Normocephalic, atraumatic, pupils are equal, round and reactive to light and accommodation. Extraocular tracking is good without limitation to gaze excursion or nystagmus noted. Normal smooth pursuit is noted.  Hearing is grossly intact. Face is symmetric with normal facial animation and normal facial sensation. Speech is clear with no dysarthria noted. There is no hypophonia. There is no lip, neck/head, jaw or voice tremor. Neck is supple with full range of passive and active motion. Oropharynx exam reveals: mild mouth dryness, good dental hygiene and benign appearing airway with smaller uvula, tonsils are 1+, Mallampati class I, neck circumference of 14 in. She has a mild overbite. Tongue protrudes centrally and palate elevates symmetrically.    Chest: Clear to auscultation without wheezing, rhonchi or crackles noted.  Heart: S1+S2+0, regular and normal without murmurs, rubs or gallops noted.   Abdomen: Soft, non-tender and non-distended with normal bowel sounds appreciated on auscultation.  Extremities: There is no pitting edema in the distal lower extremities bilaterally. Pedal pulses are intact.  Skin: Warm and dry without trophic changes noted.  Musculoskeletal: exam reveals no obvious joint deformities, tenderness or joint swelling or erythema.   Neurologically:  Mental status: The patient is awake, alert and oriented in all 4 spheres. Her immediate and remote memory, attention, language skills and fund of knowledge are appropriate. There is no evidence of aphasia, agnosia, apraxia or anomia. Speech is clear with normal prosody and enunciation. Thought process is linear. Mood is normal and affect is normal.  Cranial nerves II - XII are as described above under HEENT exam. In addition: shoulder shrug is normal with equal shoulder height noted. Motor exam: Normal bulk, strength and tone is noted. There is no drift, tremor or rebound. Romberg is negative. Reflexes are 2+ throughout. Fine motor skills and coordination: intact with normal finger taps, normal hand movements, normal rapid alternating patting, normal foot taps and normal foot agility.  Cerebellar testing: No dysmetria or intention tremor.  There is no truncal or gait ataxia.  Sensory exam: intact to light touch, pinprick, vibration, temperature proprioception in the upper and lower extremities.  Gait, station and balance: She stands easily. No veering to one side is noted. No leaning to one side is noted. Posture is age-appropriate and stance is narrow based. Gait shows normal stride length and normal pace. No problems turning are noted. Tandem walk is slightly challenging for her.                 Assessment and Plan:   In summary, Angel Bennett is a very pleasant 52 y.o.-year old female  with an underlying medical history of reflux disease, hyperthyroidism, palpitations, history of C. difficile colitis, and obesity, whose history and physical exam are concerning for obstructive sleep apnea (OSA). I had a long chat with the patient and her husband about my findings and the diagnosis of OSA, its prognosis and treatment options. We talked about medical treatments, surgical interventions and non-pharmacological approaches. I explained in particular the risks and ramifications of untreated moderate to severe OSA, especially with respect to developing cardiovascular disease down the Road, including congestive heart failure, difficult to treat hypertension, cardiac arrhythmias, or stroke. Even type 2 diabetes has, in part, been linked to untreated OSA. Symptoms of untreated OSA include daytime sleepiness, memory problems, mood irritability and mood disorder such as depression and anxiety, lack of energy, as well as recurrent headaches, especially morning headaches. We talked about trying to maintain a healthy lifestyle in general, as well as the importance of weight control. I encouraged the patient to eat healthy, exercise daily and keep well hydrated, to keep a scheduled bedtime and wake time routine, to not skip any meals and eat healthy snacks in between meals. I advised the patient not to drive when feeling sleepy. I recommended the following at  this time: sleep study with potential positive airway pressure titration. (We will score hypopneas at 3%).   I explained the sleep test procedure to the patient and also outlined possible surgical and non-surgical treatment options of OSA, including the use of a custom-made dental device (which would require a referral to a specialist dentist or oral surgeon), upper airway surgical options, such as pillar implants, radiofrequency surgery, tongue base surgery, and UPPP (which would involve a referral to an ENT surgeon). Rarely, jaw surgery such as mandibular advancement may be considered.  I also explained the CPAP treatment option to the patient, who indicated that she would be willing to try CPAP if the need arises. I explained the importance of being compliant with PAP treatment, not  only for insurance purposes but primarily to improve her symptoms, and for the patient's long term health benefit, including to reduce Her cardiovascular risks. I answered all their questions today and the patient and her husband were in agreement. I would like to see her back after the sleep study is completed and encouraged her to call with any interim questions, concerns, problems or updates.   Thank you very much for allowing me to participate in the care of this nice patient. If I can be of any further assistance to you please do not hesitate to call me at [phone].    Sincerely,   Star Age, MD, PhD

## 2017-08-28 ENCOUNTER — Ambulatory Visit: Payer: BLUE CROSS/BLUE SHIELD | Admitting: Family Medicine

## 2017-09-15 ENCOUNTER — Telehealth: Payer: Self-pay | Admitting: Neurology

## 2017-09-15 NOTE — Telephone Encounter (Signed)
We have attempted to call the patient two times to schedule sleep study. Patient has been unavailable at the phone numbers we have on file, and has not returned our calls. At this time, we will send a letter asking patient to please contact the sleep lab.  °

## 2017-11-20 ENCOUNTER — Encounter: Payer: Self-pay | Admitting: Family Medicine

## 2018-01-12 ENCOUNTER — Ambulatory Visit (INDEPENDENT_AMBULATORY_CARE_PROVIDER_SITE_OTHER): Payer: Self-pay

## 2018-01-12 ENCOUNTER — Encounter (HOSPITAL_COMMUNITY): Payer: Self-pay | Admitting: Emergency Medicine

## 2018-01-12 ENCOUNTER — Ambulatory Visit (HOSPITAL_COMMUNITY)
Admission: EM | Admit: 2018-01-12 | Discharge: 2018-01-12 | Disposition: A | Discharge status: Home / Self Care | Payer: BLUE CROSS/BLUE SHIELD | Attending: Family Medicine | Admitting: Family Medicine

## 2018-01-12 ENCOUNTER — Other Ambulatory Visit: Payer: Self-pay

## 2018-01-12 DIAGNOSIS — M542 Cervicalgia: Secondary | ICD-10-CM

## 2018-01-12 DIAGNOSIS — M791 Myalgia, unspecified site: Secondary | ICD-10-CM

## 2018-01-12 DIAGNOSIS — R0789 Other chest pain: Secondary | ICD-10-CM

## 2018-01-12 DIAGNOSIS — R51 Headache: Secondary | ICD-10-CM | POA: Diagnosis not present

## 2018-01-12 DIAGNOSIS — M545 Low back pain: Secondary | ICD-10-CM

## 2018-01-12 MED ORDER — NAPROXEN 500 MG PO TABS: 500.0000 mg | ORAL_TABLET | Freq: Two times a day (BID) | ORAL | 0 refills | Status: DC

## 2018-01-12 MED ORDER — KETOROLAC TROMETHAMINE 60 MG/2ML IM SOLN
INTRAMUSCULAR | Status: AC
  Filled 2018-01-12: qty 2

## 2018-01-12 MED ORDER — KETOROLAC TROMETHAMINE 60 MG/2ML IM SOLN
60.0000 mg | Freq: Once | INTRAMUSCULAR | Status: AC
  Administered 2018-01-12: 60 mg via INTRAMUSCULAR

## 2018-01-12 MED ORDER — CYCLOBENZAPRINE HCL 10 MG PO TABS: 5.0000 mg | ORAL_TABLET | Freq: Every day | ORAL | 0 refills | Status: DC
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
## 2018-01-12 NOTE — Discharge Instructions (Addendum)
It was nice meeting you!!  Your x rays were normal.  We gave you an injection of Toradol in clinic for pain.  We will send you home with some flexeril for muscle relaxant and naproxen for pain and inflammation. Be aware the Flexeril may cause drowsiness.  You may also use heat or ice to the areas.  If you develop any worsening symptoms to include severe pain, dizziness, increased weakness in the extremities or loss of bowel and bladder please go to the ER.

## 2018-01-22 ENCOUNTER — Ambulatory Visit (INDEPENDENT_AMBULATORY_CARE_PROVIDER_SITE_OTHER): Payer: BLUE CROSS/BLUE SHIELD | Admitting: Family Medicine

## 2018-01-22 ENCOUNTER — Encounter: Payer: Self-pay | Admitting: Family Medicine

## 2018-01-22 ENCOUNTER — Other Ambulatory Visit: Payer: Self-pay

## 2018-01-22 VITALS — BP 136/86 | HR 78 | Temp 98.6°F | Ht 65.0 in | Wt 196.2 lb

## 2018-01-22 DIAGNOSIS — Z1331 Encounter for screening for depression: Secondary | ICD-10-CM

## 2018-01-22 DIAGNOSIS — M5440 Lumbago with sciatica, unspecified side: Secondary | ICD-10-CM

## 2018-01-22 DIAGNOSIS — S39012A Strain of muscle, fascia and tendon of lower back, initial encounter: Secondary | ICD-10-CM | POA: Diagnosis not present

## 2018-01-22 MED ORDER — MELOXICAM 7.5 MG PO TABS: 7.5000 mg | ORAL_TABLET | Freq: Every day | ORAL | 0 refills | Status: DC
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
## 2018-01-24 ENCOUNTER — Encounter: Payer: Self-pay | Admitting: Family Medicine

## 2018-01-29 ENCOUNTER — Encounter (INDEPENDENT_AMBULATORY_CARE_PROVIDER_SITE_OTHER): Payer: Self-pay | Admitting: Surgery

## 2018-01-29 ENCOUNTER — Ambulatory Visit (INDEPENDENT_AMBULATORY_CARE_PROVIDER_SITE_OTHER): Payer: BLUE CROSS/BLUE SHIELD | Admitting: Surgery

## 2018-01-29 DIAGNOSIS — M5441 Lumbago with sciatica, right side: Secondary | ICD-10-CM

## 2018-01-29 DIAGNOSIS — M5416 Radiculopathy, lumbar region: Secondary | ICD-10-CM

## 2018-01-29 DIAGNOSIS — M5442 Lumbago with sciatica, left side: Secondary | ICD-10-CM
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
## 2018-01-30 ENCOUNTER — Ambulatory Visit: Payer: BLUE CROSS/BLUE SHIELD | Admitting: Family Medicine

## 2018-02-16 ENCOUNTER — Ambulatory Visit
Admission: RE | Admit: 2018-02-16 | Discharge: 2018-02-16 | Disposition: A | Discharge status: Home / Self Care | Payer: BLUE CROSS/BLUE SHIELD | Source: Ambulatory Visit | Attending: Surgery | Admitting: Surgery

## 2018-02-16 DIAGNOSIS — M5441 Lumbago with sciatica, right side: Secondary | ICD-10-CM

## 2018-02-16 DIAGNOSIS — M5442 Lumbago with sciatica, left side: Principal | ICD-10-CM

## 2018-03-03 ENCOUNTER — Telehealth (INDEPENDENT_AMBULATORY_CARE_PROVIDER_SITE_OTHER): Payer: Self-pay | Admitting: Specialist

## 2018-03-03 NOTE — Telephone Encounter (Signed)
Patient called asked if she can get a call back with the MRI results of her MRI? Patient is scheduled in October for MRI review. The number to contact patient is [phone]

## 2018-03-05 NOTE — Telephone Encounter (Signed)
Patient called asked if she can get a call back with the MRI results of her MRI? Patient is scheduled in October for MRI review. The number to contact patient is [phone]

## 2018-04-23 ENCOUNTER — Encounter (INDEPENDENT_AMBULATORY_CARE_PROVIDER_SITE_OTHER): Payer: Self-pay | Admitting: Specialist

## 2018-04-23 ENCOUNTER — Ambulatory Visit (INDEPENDENT_AMBULATORY_CARE_PROVIDER_SITE_OTHER): Payer: BLUE CROSS/BLUE SHIELD | Admitting: Specialist

## 2018-04-23 VITALS — BP 127/79 | HR 60 | Ht 65.0 in | Wt 194.0 lb

## 2018-04-23 DIAGNOSIS — M5440 Lumbago with sciatica, unspecified side: Secondary | ICD-10-CM | POA: Diagnosis not present

## 2018-04-23 DIAGNOSIS — M48062 Spinal stenosis, lumbar region with neurogenic claudication: Secondary | ICD-10-CM

## 2018-04-23 DIAGNOSIS — S39012A Strain of muscle, fascia and tendon of lower back, initial encounter: Secondary | ICD-10-CM | POA: Diagnosis not present

## 2018-04-23 MED ORDER — MELOXICAM 7.5 MG PO TABS: 7.5000 mg | ORAL_TABLET | Freq: Every day | ORAL | 0 refills | Status: DC

## 2018-04-23 MED ORDER — GABAPENTIN 100 MG PO CAPS: 100.0000 mg | ORAL_CAPSULE | Freq: Every day | ORAL | 2 refills | Status: DC
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
## 2018-04-23 NOTE — Patient Instructions (Signed)
Avoid bending, stooping and avoid lifting weights greater than 10 lbs. Avoid prolong standing and walking. Avoid frequent bending and stooping  No lifting greater than 10 lbs. May use ice or moist heat for pain. Weight loss is of benefit. Handicap license is approved. Dr. Newton's secretary/Assistant will call to arrange for epidural steroid injection  

## 2018-04-29 ENCOUNTER — Telehealth: Payer: Self-pay | Admitting: Cardiology

## 2018-04-30 ENCOUNTER — Telehealth: Payer: Self-pay | Admitting: Cardiology

## 2018-04-30 NOTE — Telephone Encounter (Signed)
Called phone number, voicemail box is full so unable to leave message to contact for f/up.

## 2018-05-04 NOTE — Telephone Encounter (Signed)
done

## 2018-05-15 ENCOUNTER — Encounter (INDEPENDENT_AMBULATORY_CARE_PROVIDER_SITE_OTHER): Payer: Self-pay | Admitting: Physical Medicine and Rehabilitation

## 2018-05-15 ENCOUNTER — Ambulatory Visit (INDEPENDENT_AMBULATORY_CARE_PROVIDER_SITE_OTHER): Payer: Self-pay

## 2018-05-15 ENCOUNTER — Ambulatory Visit (INDEPENDENT_AMBULATORY_CARE_PROVIDER_SITE_OTHER): Payer: BLUE CROSS/BLUE SHIELD | Admitting: Physical Medicine and Rehabilitation

## 2018-05-15 VITALS — BP 139/87 | HR 64

## 2018-05-15 DIAGNOSIS — M5416 Radiculopathy, lumbar region: Secondary | ICD-10-CM

## 2018-05-15 MED ORDER — METHYLPREDNISOLONE ACETATE 80 MG/ML IJ SUSP
80.0000 mg | Freq: Once | INTRAMUSCULAR | Status: AC
  Administered 2018-05-15: 80 mg

## 2018-05-15 NOTE — Patient Instructions (Signed)

## 2018-05-15 NOTE — Progress Notes (Signed)
.  Numeric Pain Rating Scale and Functional Assessment Average Pain 7   In the last MONTH (on 0-10 scale) has pain interfered with the following?  1. General activity like being  able to carry out your everyday physical activities such as walking, climbing stairs, carrying groceries, or moving a chair?  Rating(7)   +Driver, -BT, -Dye Allergies.   

## 2018-06-10 NOTE — Progress Notes (Signed)
LEGACIE Bennett - 52 y.o. female MRN [id number]  Date of birth: 1965/10/27  Office Visit Note: Visit Date: 05/15/2018 PCP: Rutherford Guys, MD Referred by: Rutherford Guys, MD  Subjective: Chief Complaint  Patient presents with  . Lower Back - Pain   HPI:  Angel Bennett is a 52 y.o. female who comes in today For planned left L4-5 interlaminar epidural steroid injection as requested by Dr. Basil Dess.  Patient is having left radicular leg pain with failure to improve with conservative care.  Recent MRI shows L4-5 spondylosis and lateral recess stenosis.  ROS Otherwise per HPI.  Assessment & Plan: Visit Diagnoses:  1. Lumbar radiculopathy     Plan: No additional findings.   Meds & Orders:  Meds ordered this encounter  Medications  . methylPREDNISolone acetate (DEPO-MEDROL) injection 80 mg    Orders Placed This Encounter  Procedures  . XR C-ARM NO REPORT  . Epidural Steroid injection    Follow-up: Return in about 3 weeks (around 06/05/2018) for Basil Dess, MD.   Procedures: No procedures performed  Lumbar Epidural Steroid Injection - Interlaminar Approach with Fluoroscopic Guidance  Patient: Angel Bennett      Date of Birth: 1965/07/20 MRN: [id number] PCP: Rutherford Guys, MD      Visit Date: 05/15/2018   Universal Protocol:     Consent Given By: the patient  Position: PRONE  Additional Comments: Vital signs were monitored before and after the procedure. Patient was prepped and draped in the usual sterile fashion. The correct patient, procedure, and site was verified.   Injection Procedure Details:  Procedure Site One Meds Administered:  Meds ordered this encounter  Medications  . methylPREDNISolone acetate (DEPO-MEDROL) injection 80 mg     Laterality: Left  Location/Site:  L4-L5  Needle size: 20 G  Needle type: Tuohy  Needle Placement: Paramedian epidural  Findings:   -Comments: Excellent flow of contrast into the epidural space.  Procedure  Details: Using a paramedian approach from the side mentioned above, the region overlying the inferior lamina was localized under fluoroscopic visualization and the soft tissues overlying this structure were infiltrated with 4 ml. of 1% Lidocaine without Epinephrine. The Tuohy needle was inserted into the epidural space using a paramedian approach.   The epidural space was localized using loss of resistance along with lateral and bi-planar fluoroscopic views.  After negative aspirate for air, blood, and CSF, a 2 ml. volume of Isovue-250 was injected into the epidural space and the flow of contrast was observed. Radiographs were obtained for documentation purposes.    The injectate was administered into the level noted above.   Additional Comments:  The patient tolerated the procedure well Dressing: Band-Aid    Post-procedure details: Patient was observed during the procedure. Post-procedure instructions were reviewed.  Patient left the clinic in stable condition.    Clinical History: MRI LUMBAR SPINE WITHOUT CONTRAST  TECHNIQUE: Multiplanar, multisequence MR imaging of the lumbar spine was performed. No intravenous contrast was administered.  COMPARISON:  Multiple exams, including lumbar spine radiographs from 01/12/2018  FINDINGS: Segmentation: The lowest lumbar type non-rib-bearing vertebra is labeled as L5.  Alignment:  No vertebral subluxation is observed.  Vertebrae: Subtle degenerative endplate findings along the anterior superior endplates of L2 and L3.  Conus medullaris and cauda equina: Conus extends to the T12-L1 level. Conus and cauda equina appear normal.  Paraspinal and other soft tissues: Unremarkable  Disc levels:  L1-2: Unremarkable  L2-3: Unremarkable  L3-4: Borderline  right foraminal stenosis due to disc bulge and mild facet arthropathy.  L4-5: Mild bilateral subarticular lateral recess stenosis and borderline bilateral foraminal  stenosis due to disc bulge and facet arthropathy along with mild ligamentum flavum redundancy. There is also borderline central narrowing of the thecal sac at this level.  L5-S1: Unremarkable.  IMPRESSION: 1. Mild bilateral impingement at L4-5 due to spondylosis and degenerative disc disease as detailed above.   Electronically Signed   By: Van Clines M.D.   On: 02/16/2018 14:53     Objective:  VS:  HT:    WT:   BMI:     BP:139/87  HR:64bpm  TEMP: ( )  RESP:  Physical Exam  Ortho Exam Imaging: No results found.

## 2018-06-10 NOTE — Procedures (Signed)
Lumbar Epidural Steroid Injection - Interlaminar Approach with Fluoroscopic Guidance  Patient: Angel Bennett      Date of Birth: December 29, 1965 MRN: [id number] PCP: Rutherford Guys, MD      Visit Date: 05/15/2018   Universal Protocol:     Consent Given By: the patient  Position: PRONE  Additional Comments: Vital signs were monitored before and after the procedure. Patient was prepped and draped in the usual sterile fashion. The correct patient, procedure, and site was verified.   Injection Procedure Details:  Procedure Site One Meds Administered:  Meds ordered this encounter  Medications  . methylPREDNISolone acetate (DEPO-MEDROL) injection 80 mg     Laterality: Left  Location/Site:  L4-L5  Needle size: 20 G  Needle type: Tuohy  Needle Placement: Paramedian epidural  Findings:   -Comments: Excellent flow of contrast into the epidural space.  Procedure Details: Using a paramedian approach from the side mentioned above, the region overlying the inferior lamina was localized under fluoroscopic visualization and the soft tissues overlying this structure were infiltrated with 4 ml. of 1% Lidocaine without Epinephrine. The Tuohy needle was inserted into the epidural space using a paramedian approach.   The epidural space was localized using loss of resistance along with lateral and bi-planar fluoroscopic views.  After negative aspirate for air, blood, and CSF, a 2 ml. volume of Isovue-250 was injected into the epidural space and the flow of contrast was observed. Radiographs were obtained for documentation purposes.    The injectate was administered into the level noted above.   Additional Comments:  The patient tolerated the procedure well Dressing: Band-Aid    Post-procedure details: Patient was observed during the procedure. Post-procedure instructions were reviewed.  Patient left the clinic in stable condition.

## 2018-08-17 ENCOUNTER — Telehealth: Payer: Self-pay | Admitting: Family Medicine

## 2018-08-17 NOTE — Telephone Encounter (Signed)
Patient is wanting a referral to Cardiologist Skeet Latch at [place]. Patient states that she goes once a year to get a check up since she has a leak in her heart. Patient states Dr. Pamella Pert knows the situation. Please advise.

## 2018-08-18 ENCOUNTER — Other Ambulatory Visit: Payer: Self-pay

## 2018-08-18 DIAGNOSIS — I071 Rheumatic tricuspid insufficiency: Secondary | ICD-10-CM

## 2018-08-18 NOTE — Telephone Encounter (Signed)
Informed pt referral has been placed.

## 2018-08-21 ENCOUNTER — Other Ambulatory Visit: Payer: Self-pay

## 2018-08-21 ENCOUNTER — Encounter: Payer: Self-pay | Admitting: Family Medicine

## 2018-08-21 ENCOUNTER — Ambulatory Visit (INDEPENDENT_AMBULATORY_CARE_PROVIDER_SITE_OTHER): Payer: BLUE CROSS/BLUE SHIELD | Admitting: Family Medicine

## 2018-08-21 VITALS — BP 108/70 | HR 74 | Temp 98.0°F | Resp 16 | Ht 65.0 in | Wt 191.0 lb

## 2018-08-21 DIAGNOSIS — Z1329 Encounter for screening for other suspected endocrine disorder: Secondary | ICD-10-CM | POA: Diagnosis not present

## 2018-08-21 DIAGNOSIS — E0590 Thyrotoxicosis, unspecified without thyrotoxic crisis or storm: Secondary | ICD-10-CM

## 2018-08-21 DIAGNOSIS — Z13228 Encounter for screening for other metabolic disorders: Secondary | ICD-10-CM

## 2018-08-21 DIAGNOSIS — Z0001 Encounter for general adult medical examination with abnormal findings: Secondary | ICD-10-CM

## 2018-08-21 DIAGNOSIS — Z130 Encounter for screening for diseases of the blood and blood-forming organs and certain disorders involving the immune mechanism: Secondary | ICD-10-CM

## 2018-08-21 DIAGNOSIS — Z0000 Encounter for general adult medical examination without abnormal findings: Secondary | ICD-10-CM

## 2018-08-21 DIAGNOSIS — Z1231 Encounter for screening mammogram for malignant neoplasm of breast: Secondary | ICD-10-CM

## 2018-08-21 DIAGNOSIS — Z13220 Encounter for screening for lipoid disorders: Secondary | ICD-10-CM

## 2018-08-21 LAB — POCT URINALYSIS DIP (MANUAL ENTRY)
Bilirubin, UA: NEGATIVE
Blood, UA: NEGATIVE
Glucose, UA: NEGATIVE mg/dL
Ketones, POC UA: NEGATIVE mg/dL
Leukocytes, UA: NEGATIVE
Nitrite, UA: NEGATIVE
Protein Ur, POC: NEGATIVE mg/dL
Spec Grav, UA: <=1.005 — AB (ref 1.010–1.025)
Urobilinogen, UA: 0.2 E.U./dL
pH, UA: 6.5 (ref 5.0–8.0)

## 2018-08-21 MED ORDER — FLUTICASONE PROPIONATE 50 MCG/ACT NA SUSP: 1.0000 | Freq: Two times a day (BID) | NASAL | 6 refills | Status: DC

## 2018-08-21 MED ORDER — MONTELUKAST SODIUM 10 MG PO TABS: 10.0000 mg | ORAL_TABLET | Freq: Every day | ORAL | 6 refills | Status: DC

## 2018-08-21 NOTE — Progress Notes (Signed)
2/21/20204:03 PM  Angel Bennett 04-29-1966, 53 y.o. female [id number]  Chief Complaint  Patient presents with  . Annual Exam    HPI:   Patient is a 53 y.o. female with past medical history significant for mod TR, GERD, subclinical hyperthyroidism, spinal stenosis who presents today for CPE  Last CPE Nov 2018 Cervical Cancer Screening: pap and HPV neg 2017 Breast Cancer Screening:  Order today Colorectal Cancer Screening: 2016, + polyps, repeat 09/2019 Bone Density Testing: at age 68 Seasonal Influenza Vaccination: has got this season at work Td/Tdap Vaccination: 2016 Pneumococcal Vaccination: at age 74 Zoster Vaccination: will get thru [place] Frequency of Dental evaluation: Q6 months Frequency of Eye evaluation: does not wear glasses, goes yearly  Has been working at cutting back on fatty and sugary foods Started taking vitamin D daily several months ago  Fall Risk  08/21/2018 01/22/2018 05/27/2017 02/12/2016 11/22/2015  Falls in the past year? 0 No No No No  Injury with Fall? 0 - - - -     Depression screen [facility] 2/9 08/21/2018 01/22/2018 05/27/2017  Decreased Interest 0 3 0  Down, Depressed, Hopeless 0 3 0  PHQ - 2 Score 0 6 0  Altered sleeping - 3 -  Tired, decreased energy - 3 -  Change in appetite - 3 -  Feeling bad or failure about yourself  - 0 -  Trouble concentrating - 3 -  Moving slowly or fidgety/restless - 0 -  Suicidal thoughts - 0 -  PHQ-9 Score - 18 -  Difficult doing work/chores - Not difficult at all -    No Known Allergies  Prior to Admission medications   Not on File    Past Medical History:  Diagnosis Date  . Abdominal pain    Diffuse mild  . C. difficile colitis [id number]   Treated w PO Metronidazole  . Chest pain 05/20/2015  . Constipation, chronic   . GERD (gastroesophageal reflux disease)   . Hx of adenomatous polyp of colon 10/27/2014  . Hyperthyroidism 05/20/2015  . Palpitations 05/20/2015  . Ringing in ears    both ears    Past  Surgical History:  Procedure Laterality Date  . CESAREAN SECTION  1990  . CHOLECYSTECTOMY  03/13/2011   lap single site  . COLONOSCOPY  05/15/2011   Procedure: COLONOSCOPY;  Surgeon: Landry Dyke, MD;  Location: [facility];  Service: Endoscopy;  Laterality: N/A;  . ESOPHAGOGASTRODUODENOSCOPY  05/15/2011   Procedure: ESOPHAGOGASTRODUODENOSCOPY (EGD);  Surgeon: Landry Dyke, MD;  Location: [facility];  Service: Endoscopy;  Laterality: N/A;  . HEMORRHOID SURGERY  1990    Social History   Tobacco Use  . Smoking status: Never Smoker  . Smokeless tobacco: Never Used  Substance Use Topics  . Alcohol use: No    Family History  Problem Relation Age of Onset  . Diabetes Mother 20  . Hypertension Mother   . Hypertension Father     Review of Systems  Constitutional: Negative for chills and fever.  Respiratory: Negative for cough and shortness of breath.   Cardiovascular: Negative for chest pain, palpitations and leg swelling.  Gastrointestinal: Negative for abdominal pain, nausea and vomiting.  Endo/Heme/Allergies: Positive for environmental allergies.  All other systems reviewed and are negative.    OBJECTIVE:  Blood pressure 108/70, pulse 74, temperature 98 F (36.7 C), temperature source Oral, resp. rate 16, height 5\' 5"  (1.651 m), weight 191 lb (86.6 kg), SpO2 98 %. Body mass index is 31.78 kg/m.  Wt Readings from Last 3 Encounters:  08/21/18 191 lb (86.6 kg)  04/23/18 194 lb (88 kg)  01/22/18 196 lb 3.2 oz (89 kg)    Physical Exam Vitals signs and nursing note reviewed.  Constitutional:      Appearance: She is well-developed.  HENT:     Head: Normocephalic and atraumatic.     Right Ear: Hearing, tympanic membrane, ear canal and external ear normal.     Left Ear: Hearing, tympanic membrane, ear canal and external ear normal.  Eyes:     Conjunctiva/sclera: Conjunctivae normal.     Pupils: Pupils are equal, round, and reactive to light.  Neck:      Musculoskeletal: Neck supple.     Thyroid: No thyromegaly.  Cardiovascular:     Rate and Rhythm: Normal rate and regular rhythm.     Heart sounds: Normal heart sounds. No murmur. No friction rub. No gallop.   Pulmonary:     Effort: Pulmonary effort is normal.     Breath sounds: Normal breath sounds. No wheezing or rales.  Abdominal:     General: Bowel sounds are normal. There is no distension.     Palpations: Abdomen is soft. There is no mass.     Tenderness: There is no abdominal tenderness.  Musculoskeletal: Normal range of motion.  Lymphadenopathy:     Cervical: No cervical adenopathy.  Skin:    General: Skin is warm and dry.  Neurological:     Mental Status: She is alert and oriented to person, place, and time.     Cranial Nerves: No cranial nerve deficit.     Gait: Gait normal.     Deep Tendon Reflexes: Reflexes are normal and symmetric.    Results for orders placed or performed in visit on 08/21/18 (from the past 24 hour(s))  POCT urinalysis dipstick     Status: Abnormal   Collection Time: 08/21/18  5:00 PM  Result Value Ref Range   Color, UA yellow yellow   Clarity, UA clear clear   Glucose, UA negative negative mg/dL   Bilirubin, UA negative negative   Ketones, POC UA negative negative mg/dL   Spec Grav, UA <=1.005 (A) 1.010 - 1.025   Blood, UA negative negative   pH, UA 6.5 5.0 - 8.0   Protein Ur, POC negative negative mg/dL   Urobilinogen, UA 0.2 0.2 or 1.0 E.U./dL   Nitrite, UA Negative Negative   Leukocytes, UA Negative Negative    ASSESSMENT and PLAN  1. Annual physical exam No concerns per history or exam. Routine HCM labs ordered. HCM reviewed/discussed. Anticipatory guidance regarding healthy weight, lifestyle and choices given.   2. Subclinical hyperthyroidism - TSH - T4, Free  3. Screening for deficiency anemia - CBC with Differential/Platelet  4. Screening for endocrine, metabolic and immunity disorder - Comprehensive metabolic panel  5.  Screening for lipid disorders - Lipid panel - last meal was 7 hours  6. Visit for screening mammogram - MM DIGITAL SCREENING BILATERAL; Future  Other orders - fluticasone (FLONASE) 50 MCG/ACT nasal spray; Place 1 spray into both nostrils 2 (two) times daily. - montelukast (SINGULAIR) 10 MG tablet; Take 1 tablet (10 mg total) by mouth at bedtime.  Return in about 1 year (around 08/22/2019) for CPE.    Rutherford Guys, MD Primary Care at [place], [place] [address] Ph.  [phone] Fax [phone]

## 2018-08-21 NOTE — Patient Instructions (Addendum)
   If you have lab work done today you will be contacted with your lab results within the next 2 weeks.  If you have not heard from us then please contact us. The fastest way to get your results is to register for My Chart.   IF you received an x-ray today, you will receive an invoice from [facility]. Please contact [facility] at [phone] with questions or concerns regarding your invoice.   IF you received labwork today, you will receive an invoice from [facility]. Please contact [facility] at [phone] with questions or concerns regarding your invoice.   Our billing staff will not be able to assist you with questions regarding bills from these companies.  You will be contacted with the lab results as soon as they are available. The fastest way to get your results is to activate your My Chart account. Instructions are located on the last page of this paperwork. If you have not heard from us regarding the results in 2 weeks, please contact this office.     Preventive Care 40-64 Years, Female Preventive care refers to lifestyle choices and visits with your health care provider that can promote health and wellness. What does preventive care include?   A yearly physical exam. This is also called an annual well check.  Dental exams once or twice a year.  Routine eye exams. Ask your health care provider how often you should have your eyes checked.  Personal lifestyle choices, including: ? Daily care of your teeth and gums. ? Regular physical activity. ? Eating a healthy diet. ? Avoiding tobacco and drug use. ? Limiting alcohol use. ? Practicing safe sex. ? Taking low-dose aspirin daily starting at age 50. ? Taking vitamin and mineral supplements as recommended by your health care provider. What happens during an annual well check? The services and screenings done by your health care provider during your annual well check will depend on your age, overall health,  lifestyle risk factors, and family history of disease. Counseling Your health care provider may ask you questions about your:  Alcohol use.  Tobacco use.  Drug use.  Emotional well-being.  Home and relationship well-being.  Sexual activity.  Eating habits.  Work and work environment.  Method of birth control.  Menstrual cycle.  Pregnancy history. Screening You may have the following tests or measurements:  Height, weight, and BMI.  Blood pressure.  Lipid and cholesterol levels. These may be checked every 5 years, or more frequently if you are over 50 years old.  Skin check.  Lung cancer screening. You may have this screening every year starting at age 55 if you have a 30-pack-year history of smoking and currently smoke or have quit within the past 15 years.  Colorectal cancer screening. All adults should have this screening starting at age 50 and continuing until age 75. Your health care provider may recommend screening at age 45. You will have tests every 1-10 years, depending on your results and the type of screening test. People at increased risk should start screening at an earlier age. Screening tests may include: ? Guaiac-based fecal occult blood testing. ? Fecal immunochemical test (FIT). ? Stool DNA test. ? Virtual colonoscopy. ? Sigmoidoscopy. During this test, a flexible tube with a tiny camera (sigmoidoscope) is used to examine your rectum and lower colon. The sigmoidoscope is inserted through your anus into your rectum and lower colon. ? Colonoscopy. During this test, a long, thin, flexible tube with a tiny camera (  colonoscope) is used to examine your entire colon and rectum.  Hepatitis C blood test.  Hepatitis B blood test.  Sexually transmitted disease (STD) testing.  Diabetes screening. This is done by checking your blood sugar (glucose) after you have not eaten for a while (fasting). You may have this done every 1-3 years.  Mammogram. This may be  done every 1-2 years. Talk to your health care provider about when you should start having regular mammograms. This may depend on whether you have a family history of breast cancer.  BRCA-related cancer screening. This may be done if you have a family history of breast, ovarian, tubal, or peritoneal cancers.  Pelvic exam and Pap test. This may be done every 3 years starting at age 21. Starting at age 30, this may be done every 5 years if you have a Pap test in combination with an HPV test.  Bone density scan. This is done to screen for osteoporosis. You may have this scan if you are at high risk for osteoporosis. Discuss your test results, treatment options, and if necessary, the need for more tests with your health care provider. Vaccines Your health care provider may recommend certain vaccines, such as:  Influenza vaccine. This is recommended every year.  Tetanus, diphtheria, and acellular pertussis (Tdap, Td) vaccine. You may need a Td booster every 10 years.  Varicella vaccine. You may need this if you have not been vaccinated.  Zoster vaccine. You may need this after age 60.  Measles, mumps, and rubella (MMR) vaccine. You may need at least one dose of MMR if you were born in 1957 or later. You may also need a second dose.  Pneumococcal 13-valent conjugate (PCV13) vaccine. You may need this if you have certain conditions and were not previously vaccinated.  Pneumococcal polysaccharide (PPSV23) vaccine. You may need one or two doses if you smoke cigarettes or if you have certain conditions.  Meningococcal vaccine. You may need this if you have certain conditions.  Hepatitis A vaccine. You may need this if you have certain conditions or if you travel or work in places where you may be exposed to hepatitis A.  Hepatitis B vaccine. You may need this if you have certain conditions or if you travel or work in places where you may be exposed to hepatitis B.  Haemophilus influenzae type b  (Hib) vaccine. You may need this if you have certain conditions. Talk to your health care provider about which screenings and vaccines you need and how often you need them. This information is not intended to replace advice given to you by your health care provider. Make sure you discuss any questions you have with your health care provider. Document Released: 07/14/2015 Document Revised: 08/07/2017 Document Reviewed: 04/18/2015 Elsevier Interactive Patient Education  2019 Elsevier Inc.  

## 2018-08-22 LAB — COMPREHENSIVE METABOLIC PANEL
ALT: 15 IU/L (ref 0–32)
AST: 26 IU/L (ref 0–40)
Albumin/Globulin Ratio: 1.4 (ref 1.2–2.2)
Albumin: 4.6 g/dL (ref 3.8–4.9)
Alkaline Phosphatase: 132 IU/L — ABNORMAL HIGH (ref 39–117)
BUN/Creatinine Ratio: 13 (ref 9–23)
BUN: 9 mg/dL (ref 6–24)
Bilirubin Total: 0.3 mg/dL (ref 0.0–1.2)
CO2: 21 mmol/L (ref 20–29)
Calcium: 10 mg/dL (ref 8.7–10.2)
Chloride: 101 mmol/L (ref 96–106)
Creatinine, Ser: 0.67 mg/dL (ref 0.57–1.00)
GFR calc Af Amer: 118 mL/min/{1.73_m2} (ref >59)
GFR calc non Af Amer: 102 mL/min/{1.73_m2} (ref >59)
Globulin, Total: 3.4 g/dL (ref 1.5–4.5)
Glucose: 96 mg/dL (ref 65–99)
Potassium: 4.3 mmol/L (ref 3.5–5.2)
Sodium: 138 mmol/L (ref 134–144)
Total Protein: 8 g/dL (ref 6.0–8.5)

## 2018-08-22 LAB — CBC WITH DIFFERENTIAL/PLATELET
Basophils Absolute: 0 10*3/uL (ref 0.0–0.2)
Basos: 1 %
EOS (ABSOLUTE): 0 10*3/uL (ref 0.0–0.4)
Eos: 1 %
Hematocrit: 44.2 % (ref 34.0–46.6)
Hemoglobin: 14.7 g/dL (ref 11.1–15.9)
Immature Grans (Abs): 0 10*3/uL (ref 0.0–0.1)
Immature Granulocytes: 0 %
Lymphocytes Absolute: 2.5 10*3/uL (ref 0.7–3.1)
Lymphs: 48 %
MCH: 29.7 pg (ref 26.6–33.0)
MCHC: 33.3 g/dL (ref 31.5–35.7)
MCV: 89 fL (ref 79–97)
Monocytes Absolute: 0.4 10*3/uL (ref 0.1–0.9)
Monocytes: 7 %
Neutrophils Absolute: 2.3 10*3/uL (ref 1.4–7.0)
Neutrophils: 43 %
Platelets: 259 10*3/uL (ref 150–450)
RBC: 4.95 x10E6/uL (ref 3.77–5.28)
RDW: 12.6 % (ref 11.7–15.4)
WBC: 5.2 10*3/uL (ref 3.4–10.8)

## 2018-08-22 LAB — TSH: TSH: 0.413 u[IU]/mL — ABNORMAL LOW (ref 0.450–4.500)

## 2018-08-22 LAB — LIPID PANEL
Chol/HDL Ratio: 2.9 ratio (ref 0.0–4.4)
Cholesterol, Total: 150 mg/dL (ref 100–199)
HDL: 51 mg/dL (ref >39)
LDL Calculated: 82 mg/dL (ref 0–99)
Triglycerides: 85 mg/dL (ref 0–149)
VLDL Cholesterol Cal: 17 mg/dL (ref 5–40)

## 2018-08-22 LAB — T4, FREE: Free T4: 1.1 ng/dL (ref 0.82–1.77)

## 2018-09-01 ENCOUNTER — Encounter: Payer: Self-pay | Admitting: Radiology

## 2018-09-08 ENCOUNTER — Ambulatory Visit: Payer: BLUE CROSS/BLUE SHIELD | Admitting: Cardiology

## 2018-09-14 ENCOUNTER — Ambulatory Visit: Payer: BLUE CROSS/BLUE SHIELD

## 2018-09-21 ENCOUNTER — Telehealth: Payer: Self-pay

## 2018-09-21 NOTE — Telephone Encounter (Signed)
   Cardiac Questionnaire:    Since your last visit or hospitalization:    1. Have you been having new or worsening chest pain? No   2. Have you been having new or worsening shortness of breath? No 3. Have you been having new or worsening leg swelling, wt gain, or increase in abdominal girth (pants fitting more tightly)? No   4. Have you had any passing out spells? No    Pt states she has some SOB but it's not new or worsen. Pt states she has waited a long time for appointment and would like to keep scheduled appointment.  _____________   EOFHQ-19 Pre-Screening Questions:  . Do you currently have a fever? No (yes = cancel and refer to pcp for e-visit) . Have you recently travelled on a cruise, internationally, or to [place], [place], [place], [place], [place], or [place], [place] [employer]) ? No (yes = cancel, stay home, monitor symptoms, and contact pcp or initiate e-visit if symptoms develop) . Have you been in contact with someone that is currently pending confirmation of Covid19 testing or has been confirmed to have the [place] virus?  No (yes = cancel, stay home, away from tested individual, monitor symptoms, and contact pcp or initiate e-visit if symptoms develop) . Are you currently experiencing fatigue or cough? No (yes = pt should be prepared to have a mask placed at the time of their visit).

## 2018-09-22 ENCOUNTER — Ambulatory Visit: Payer: BLUE CROSS/BLUE SHIELD | Admitting: Cardiology

## 2018-09-22 ENCOUNTER — Telehealth: Payer: Self-pay | Admitting: Cardiology

## 2018-09-22 DIAGNOSIS — R0602 Shortness of breath: Secondary | ICD-10-CM

## 2018-09-22 NOTE — Addendum Note (Signed)
Addended by: Meryl Crutch on: 09/22/2018 11:19 AM   Modules accepted: Orders

## 2018-09-22 NOTE — Telephone Encounter (Signed)
   Primary Cardiologist:  Buford Dresser (new, not seen in >3 years)  Patient contacted.  History reviewed.  No symptoms to suggest any unstable cardiac conditions.  Based on discussion, with current pandemic situation, we will be postponing this appointment for Wayne Sever with a plan for f/u in 3 mos or sooner if feasible/necessary.  If symptoms change, she has been instructed to contact our office.   Discussed her symptoms. Has a long term history of shortness of breath, not new/worsening. No chest pain, no syncope, no edema. Her last echo from 2016 showed moderate TR but no other significant abnormalities.    Cardiac Questionnaire:   Since your last visit or hospitalization:   1. Have you been having new or worsening chest pain? No   2. Have you been having new or worsening shortness of breath? Stable, chronic 3. Have you been having new or worsening leg swelling, wt gain, or increase in abdominal girth (pants fitting more tightly)? no   4. Have you had any passing out spells? no    *A YES to any of these questions would result in the appointment being kept. *If all the answers to these questions are NO, we should indicate that given the current situation regarding the worldwide coronarvirus pandemic, at the recommendation of the CDC, we are looking to limit gatherings in our waiting area, and thus will reschedule their appointment beyond four weeks from today.    We will order an echocardiogram given her shortness of breath, to be done before her rescheduled appt in June. She is amenable to the plan and will call us with any new questions or concerns.  Buford Dresser, MD  09/22/2018 10:42 AM         .

## 2018-10-02 ENCOUNTER — Telehealth: Payer: Self-pay | Admitting: Cardiology

## 2018-10-02 NOTE — Telephone Encounter (Signed)
Called patient and LVM to call back to schedule echo before Dr. Harrell Gave office visit in June.

## 2018-11-19 ENCOUNTER — Telehealth (HOSPITAL_COMMUNITY): Payer: Self-pay

## 2018-11-19 NOTE — Telephone Encounter (Signed)
LMTCB echo rescheduling.

## 2018-11-25 ENCOUNTER — Ambulatory Visit: Payer: Self-pay | Admitting: Family Medicine

## 2018-11-25 NOTE — Telephone Encounter (Signed)
Returned call to patient who states she was seen at urgent care last week for a sinus infection.  She states she has had sinus issues for many years.  She is taking a 10 day course of amoxicillin and prednisone.  She states that she still has pressure and pain to her sinus areas. She rates the pain at 7.  She states that the mucus is yellow with red streaks. She denies fever and cough. She breaths from her mouth. She feels that she has fluid in her ear.  She states that it is noisy. She states that the doctor at urgent care says she needs a CT of her sinuses and she needed to contact her PCP for the order. Care advice read to patient. Patient verbalized understanding of all instructions. Call transferred to office for appointment. Reason for Disposition . [1] Taking antibiotic > 72 hours (3 days) AND [2] sinus pain not improved  Answer Assessment - Initial Assessment Questions 1. LOCATION: "Where does it hurt?"      Around face  2. ONSET: "When did the sinus pain start?"  (e.g., hours, days)      Last week 3. SEVERITY: "How bad is the pain?"   (Scale 1-10; mild, moderate or severe)   - MILD (1-3): doesn't interfere with normal activities    - MODERATE (4-7): interferes with normal activities (e.g., work or school) or awakens from sleep   - SEVERE (8-10): excruciating pain and patient unable to do any normal activities       7 4. RECURRENT SYMPTOM: "Have you ever had sinus problems before?" If so, ask: "When was the last time?" and "What happened that time?"     Sinus bothers her a lot 5. NASAL CONGESTION: "Is the nose blocked?" If so, ask, "Can you open it or must you breathe through the mouth?"     Breathing g Through her mouth 6. NASAL DISCHARGE: "Do you have discharge from your nose?" If so ask, "What color?"     Red yellow 7. FEVER: "Do you have a fever?" If so, ask: "What is it, how was it measured, and when did it start?"      No 8. OTHER SYMPTOMS: "Do you have any other symptoms?"  (e.g., sore throat, cough, earache, difficulty breathing)   Ear is congested noisy 9. PREGNANCY: "Is there any chance you are pregnant?" "When was your last menstrual period?"    No stopped last year  Answer Assessment - Initial Assessment Questions 1. ANTIBIOTIC: "What antibiotic are you receiving?" "How many times per day?" taking amoxicillon     Last week for 10 days 2. ONSET: "When was the antibiotic started?"     Last week rx for  prednisone 3. PAIN: "How bad is the sinus pain?"   (Scale 1-10; mild, moderate or severe)   - MILD (1-3): doesn't interfere with normal activities    - MODERATE (4-7): interferes with normal activities (e.g., work or school) or awakens from sleep   - SEVERE (8-10): excruciating pain and patient unable to do any normal activities        7 4. FEVER: "Do you have a fever?" If so, ask: "What is it, how was it measured, and when did it start?"      No 5. SYMPTOMS: "Are there any other symptoms you're concerned about?" If so, ask: "When did it start?"     Last week 6. PREGNANCY: "Is there any chance you are pregnant?" "When was your last menstrual period?"  No  Protocols used: SINUS INFECTION ON ANTIBIOTIC FOLLOW-UP CALL-A-AH, SINUS PAIN OR CONGESTION-A-AH

## 2018-11-26 ENCOUNTER — Telehealth (HOSPITAL_COMMUNITY): Payer: Self-pay

## 2018-11-26 ENCOUNTER — Telehealth (INDEPENDENT_AMBULATORY_CARE_PROVIDER_SITE_OTHER): Payer: BLUE CROSS/BLUE SHIELD | Admitting: Family Medicine

## 2018-11-26 DIAGNOSIS — J329 Chronic sinusitis, unspecified: Secondary | ICD-10-CM | POA: Diagnosis not present

## 2018-11-26 NOTE — Telephone Encounter (Signed)

## 2018-11-26 NOTE — Progress Notes (Signed)
Virtual Visit Note  I connected with patient on 11/26/18 at 244pm by phone and verified that I am speaking with the correct person using two identifiers. MARKASIA CARROL is currently located at car and patient is currently with them during visit. The provider, Rutherford Guys, MD is located in their office at time of visit.  I discussed the limitations, risks, security and privacy concerns of performing an evaluation and management service by telephone and the availability of in person appointments. I also discussed with the patient that there may be a patient responsible charge related to this service. The patient expressed understanding and agreed to proceed.   CC: sinus issues  HPI ? Patient is a 53 y.o. female with past medical history significant for mod TR, GERD, subclinical hyperthyroidism, spinal stenosis who presents today for sinus issues  Having constant sinus congestion Feels that it is hard breath on the right side She denies occ scant bloody mucous Using flonase and Singulair Has been on augmentin for past 5 days, better during the day but still having issues on the right at night Has never seen ENT Does not smoke Has recurring sinus infection, mostly on the right side  Has not been using nasal saline washes nor oral decongestants Requesting CT scan, referral to ENT  No Known Allergies  Prior to Admission medications   Medication Sig Start Date End Date Taking? Authorizing Provider  amoxicillin-clavulanate (AUGMENTIN) 875-125 MG tablet TK 1 T PO BID FOR 10 DAYS 11/19/18  Yes [provider]  ibuprofen (ADVIL) 600 MG tablet  11/25/18  Yes [provider]  methocarbamol (ROBAXIN) 500 MG tablet  11/19/18  Yes [provider]  predniSONE (DELTASONE) 5 MG tablet  11/19/18  Yes [provider]  fluticasone (FLONASE) 50 MCG/ACT nasal spray Place 1 spray into both nostrils 2 (two) times daily. Patient not taking: Reported on 11/26/2018 08/21/18    Rutherford Guys, MD  montelukast (SINGULAIR) 10 MG tablet Take 1 tablet (10 mg total) by mouth at bedtime. Patient not taking: Reported on 11/26/2018 08/21/18   Rutherford Guys, MD    Past Medical History:  Diagnosis Date  . Abdominal pain    Diffuse mild  . C. difficile colitis [id number]   Treated w PO Metronidazole  . Chest pain 05/20/2015  . Constipation, chronic   . GERD (gastroesophageal reflux disease)   . Hx of adenomatous polyp of colon 10/27/2014  . Hyperthyroidism 05/20/2015  . Palpitations 05/20/2015  . Ringing in ears    both ears    Past Surgical History:  Procedure Laterality Date  . CESAREAN SECTION  1990  . CHOLECYSTECTOMY  03/13/2011   lap single site  . COLONOSCOPY  05/15/2011   Procedure: COLONOSCOPY;  Surgeon: Landry Dyke, MD;  Location: [facility];  Service: Endoscopy;  Laterality: N/A;  . ESOPHAGOGASTRODUODENOSCOPY  05/15/2011   Procedure: ESOPHAGOGASTRODUODENOSCOPY (EGD);  Surgeon: Landry Dyke, MD;  Location: [facility];  Service: Endoscopy;  Laterality: N/A;  . HEMORRHOID SURGERY  1990    Social History   Tobacco Use  . Smoking status: Never Smoker  . Smokeless tobacco: Never Used  Substance Use Topics  . Alcohol use: No    Family History  Problem Relation Age of Onset  . Diabetes Mother 51  . Hypertension Mother   . Hypertension Father     ROS Per hpi  Objective  Vitals as reported by the patient: none   ASSESSMENT and PLAN 1. Recurrent sinus infections  Complete abx, advised use of oral decongestants and nasal saline washes. Defer imagining to ENT - Ambulatory referral to ENT  Other orders   FOLLOW-UP: prn   The above assessment and management plan was discussed with the patient. The patient verbalized understanding of and has agreed to the management plan. Patient is aware to call the clinic if symptoms persist or worsen. Patient is aware when to return to the clinic for a follow-up visit. Patient educated on when  it is appropriate to go to the emergency department.    I provided 12 minutes of non-face-to-face time during this encounter.  Rutherford Guys, MD Primary Care at [place], [place] [address] Ph.  [phone] Fax [phone]

## 2018-11-26 NOTE — Progress Notes (Signed)
Experiencing sinus issues for years now. Was told that she may have to have a cat scan done for futher review. The antibiotics, she is taking helps during the day. But at night feel stuffy and can't breathe. She has stopped taking the flonase and singular at instructed for 1 wk.

## 2018-11-27 ENCOUNTER — Ambulatory Visit (HOSPITAL_COMMUNITY): Payer: BLUE CROSS/BLUE SHIELD | Attending: Cardiology

## 2018-11-27 ENCOUNTER — Other Ambulatory Visit: Payer: Self-pay

## 2018-11-27 DIAGNOSIS — R0602 Shortness of breath: Secondary | ICD-10-CM | POA: Diagnosis present

## 2018-11-27 MED ORDER — PERFLUTREN LIPID MICROSPHERE
1.0000 mL | INTRAVENOUS | Status: AC | PRN
  Administered 2018-11-27: 2 mL via INTRAVENOUS

## 2018-11-30 ENCOUNTER — Telehealth: Payer: Self-pay

## 2018-11-30 NOTE — Telephone Encounter (Signed)
Called pt to change appointment to virtual visit. Pt is agreeable but state she will need her friend present to help her understand. Pt will call back to to confirm time.

## 2018-12-02 ENCOUNTER — Telehealth: Payer: Self-pay | Admitting: Cardiology

## 2018-12-02 NOTE — Telephone Encounter (Signed)
[phone]/consent/ my chart via emailed/ pre reg completed

## 2018-12-03 ENCOUNTER — Encounter: Payer: Self-pay | Admitting: Cardiology

## 2018-12-03 ENCOUNTER — Telehealth (INDEPENDENT_AMBULATORY_CARE_PROVIDER_SITE_OTHER): Payer: BLUE CROSS/BLUE SHIELD | Admitting: Cardiology

## 2018-12-03 ENCOUNTER — Other Ambulatory Visit: Payer: Self-pay

## 2018-12-03 VITALS — Ht 65.0 in

## 2018-12-03 DIAGNOSIS — R0789 Other chest pain: Secondary | ICD-10-CM

## 2018-12-03 DIAGNOSIS — Z712 Person consulting for explanation of examination or test findings: Secondary | ICD-10-CM

## 2018-12-03 DIAGNOSIS — I071 Rheumatic tricuspid insufficiency: Secondary | ICD-10-CM

## 2018-12-03 DIAGNOSIS — R600 Localized edema: Secondary | ICD-10-CM

## 2018-12-03 DIAGNOSIS — Z7189 Other specified counseling: Secondary | ICD-10-CM

## 2018-12-03 NOTE — Patient Instructions (Signed)

## 2018-12-03 NOTE — Progress Notes (Signed)
Virtual Visit via Video Note   This visit type was conducted due to national recommendations for restrictions regarding the COVID-19 Pandemic (e.g. social distancing) in an effort to limit this patient's exposure and mitigate transmission in our community.  Due to her co-morbid illnesses, this patient is at least at moderate risk for complications without adequate follow up.  This format is felt to be most appropriate for this patient at this time.  All issues noted in this document were discussed and addressed.  A limited physical exam was performed with this format.  Please refer to the patient's chart for her consent to telehealth for [facility].   Date:  12/03/2018   ID:  Angel Bennett, DOB [birth year], MRN [id number]  Patient Location: Home Provider Location: Home  PCP:  Rutherford Guys, MD  Cardiologist:  Buford Dresser, MD  Electrophysiologist:  None   Evaluation Performed:  New Patient Evaluation (not seen since 2016)  Chief Complaint:  New consult for moderate TR  History of Present Illness:    Angel Bennett is a 53 y.o. female with a history of moderate TR, GERD who is seen as a new consult (not seen in [facility] since 2016) at the request of Dr. Pamella Pert for the evaluation and management of moderate TR.  The patient does not have symptoms concerning for COVID-19 infection (fever, chills, cough, or new shortness of breath).   Patient's concerns today: she has many symptoms and would like to know if they are due to her heart. She has mild LE swelling which is new over the last year. She has gained weight and feels more fatigued that she has in the past. She is able to do her daily activities. She has intermittent acid sensation and burning, usually related to food, and she still can feel it sometimes the following day. Her ears are clogged, and she sometimes feels her pulse in her neck. She also endorses occasional chest pain.  Chest pain: -Initial onset: many years  ago -Quality/duration: sharp, mostly at night, wakes her up in the middle of the night, stable  -Associated symptoms: tender to touch -Aggravating/alleviating factors: worse with certain positions/movements, hurts when she presses on her chest.  -Prior cardiac history: moderate TR diagnosed previously, seen by Dr. Oval Linsey in 2016 for palpitations and chest pain -Prior ECG: NSR, IVCD on ECG 9.27.18 -Prior workup: echo, monitor -Prior treatment: none -Comorbidities: GERD, notes of subclinical hypothyroidism. Denies history of serious infections as a child. -Cardiac ROS: no syncope. Endorses intermittent DOE, some shortness of breath at night though not clear PND/orthopnea, mild LE edema bilaterally -Family history: no one with heart issues.  Past Medical History:  Diagnosis Date  . Abdominal pain    Diffuse mild  . C. difficile colitis [id number]   Treated w PO Metronidazole  . Chest pain 05/20/2015  . Constipation, chronic   . GERD (gastroesophageal reflux disease)   . Hx of adenomatous polyp of colon 10/27/2014  . Hyperthyroidism 05/20/2015  . Palpitations 05/20/2015  . Ringing in ears    both ears   Past Surgical History:  Procedure Laterality Date  . CESAREAN SECTION  1990  . CHOLECYSTECTOMY  03/13/2011   lap single site  . COLONOSCOPY  05/15/2011   Procedure: COLONOSCOPY;  Surgeon: Landry Dyke, MD;  Location: [facility];  Service: Endoscopy;  Laterality: N/A;  . ESOPHAGOGASTRODUODENOSCOPY  05/15/2011   Procedure: ESOPHAGOGASTRODUODENOSCOPY (EGD);  Surgeon: Landry Dyke, MD;  Location: [facility];  Service: Endoscopy;  Laterality: N/A;  . HEMORRHOID SURGERY  1990     Current Meds  Medication Sig  . fluticasone (FLONASE) 50 MCG/ACT nasal spray Place 1 spray into both nostrils 2 (two) times daily.  Marland Kitchen ibuprofen (ADVIL) 600 MG tablet Take 600 mg by mouth every 4 (four) hours as needed.   . methocarbamol (ROBAXIN) 500 MG tablet Take 500 mg by mouth every 6 (six)  hours as needed.   . montelukast (SINGULAIR) 10 MG tablet Take 1 tablet (10 mg total) by mouth at bedtime.  . predniSONE (DELTASONE) 5 MG tablet Take 5 mg by mouth as needed.      Allergies:   Patient has no known allergies.   Social History   Tobacco Use  . Smoking status: Never Smoker  . Smokeless tobacco: Never Used  Substance Use Topics  . Alcohol use: No  . Drug use: No     Family Hx: The patient's family history includes Diabetes (age of onset: 68) in her mother; Hypertension in her father and mother.  ROS:   Please see the history of present illness.    Constitutional: Negative for chills, fever, night sweats, unintentional weight loss  HENT: Negative for hearing loss.  Positive for ear pain/fullness Eyes: Negative for loss of vision and eye pain.  Respiratory: Negative for cough, sputum, wheezing.   Cardiovascular: See HPI. Gastrointestinal: Negative for abdominal pain, melena, and hematochezia.  Genitourinary: Negative for dysuria and hematuria.  Musculoskeletal: Negative for falls and myalgias.  Skin: Negative for itching and rash.  Neurological: Negative for focal weakness, focal sensory changes and loss of consciousness.  Endo/Heme/Allergies: Does not bruise/bleed easily.  All other systems reviewed and are negative.  Prior CV studies:   The following studies were reviewed today: Echo 11/27/18  1. The left ventricle has normal systolic function, with an ejection fraction of 55-60%. The cavity size was normal. Left ventricular diastolic parameters were normal. No evidence of left ventricular regional wall motion abnormalities.  2. The right ventricle has normal systolic function. The cavity was normal. There is no increase in right ventricular wall thickness.  3. The tricuspid valve is not well visualized. Tricuspid valve regurgitation is moderate.  4. The aortic valve is tricuspid.  5. When compared to the prior study: 05/31/2015 - degree of TR has not  significantly changed. Side by side comparison of images performed.  Of note, normal RA size, RAP 8 mmHg, IVC normal size and collapses, normal RV TAPSE, TR peak gradient of 24 mmHg.  Also personally reviewed echo from 2016, monitoring from 2016 (no significant abnormalities on monitor)  Labs/Other Tests and Data Reviewed:    EKG:  An ECG dated 03/27/17 was personally reviewed today and demonstrated:  NSR, IVCD  Recent Labs: 08/21/2018: ALT 15; BUN 9; Creatinine, Ser 0.67; Hemoglobin 14.7; Platelets 259; Potassium 4.3; Sodium 138; TSH 0.413   Recent Lipid Panel Lab Results  Component Value Date/Time   CHOL 150 08/21/2018 05:23 PM   TRIG 85 08/21/2018 05:23 PM   HDL 51 08/21/2018 05:23 PM   CHOLHDL 2.9 08/21/2018 05:23 PM   LDLCALC 82 08/21/2018 05:23 PM    Wt Readings from Last 3 Encounters:  08/21/18 191 lb (86.6 kg)  04/23/18 194 lb (88 kg)  01/22/18 196 lb 3.2 oz (89 kg)     Objective:    Vital Signs:  Ht 5\' 5"  (1.651 m)   BMI 31.78 kg/m    GEN:  no acute distress EYES:  sclerae anicteric, EOMI - Extraocular  Movements Intact RESPIRATORY:  normal respiratory effort, symmetric expansion CARDIOVASCULAR:  no visible JVD SKIN:  no rash, lesions or ulcers. MUSCULOSKELETAL:  no obvious deformities. NEURO:  alert and oriented x 3, no obvious focal deficit PSYCH:  normal affect  ASSESSMENT & PLAN:    Moderate TR: reviewed anatomy of the valve. As a right sided valve, we do not fix with surgery until it is severe and symptomatic, and even then there is some question as to how much benefit people receive from isolated repair/replacement of the tricuspid valve. We reviewed this today. We also reviewed her echo test results at length, with the following important points: -TR has not progressed -left sided systolic and diastolic function is normal -rest of the valves are normal -there is no suggestion that TR is causing systemic effects based on the echo--right atrial pressure is  normal, right ventricular pressure is normal, IVC is normal and collapses with respiration, right ventricular function is normal. This does not suggest a secondary cause at this time, such as pulmonary hypertension -she asked if there was a medication she could take to fix her valve. There is not one at this time. We also discussed that empirically treating with a medication, such as a fluid pill, when her pressures/volumes are normal can be harmful to the kidneys -I gave recommendations on her other symptoms, below -we discussed that TR does not typically result in chest pain or shortness of breath, usually just fluid/swelling, as it is a solely right sided lesion  Mild bilateral LE edema: based on echo findings, unlikely to be due to TR -recommend compression stockings, elevation -avoid salt  Chest pain: two types, sharp/tender to palpation and acid related to food -both are atypical symptoms. Pain that is tender to palpation is usually musculoskeletal/costochondral. And she does report a history of reflux--acid sensation and pain with eating is more common for this. Low suspicion that these are cardiac in nature -no further testing now. If quality of pain changes, we can re-evaluate in the future.  COVID-19 Education: The signs and symptoms of COVID-19 were discussed with the patient and how to seek care for testing (follow up with PCP or arrange E-visit).  The importance of social distancing was discussed today.  Time:   Today, I have spent 42 minutes with the patient with telehealth technology discussing the above problems. Total time including review of test results and prior workup, including personal review of the imaging, was 63 minutes.  Patient Instructions  Medication Instructions:  Your Physician recommend you continue on your current medication as directed.    If you need a refill on your cardiac medications before your next appointment, please call your pharmacy.   Lab  work: None  Testing/Procedures: None  Follow-Up: At [facility], you and your health needs are our priority.  As part of our continuing mission to provide you with exceptional heart care, we have created designated Provider Care Teams.  These Care Teams include your primary Cardiologist (physician) and Advanced Practice Providers (APPs -  Physician Assistants and Nurse Practitioners) who all work together to provide you with the care you need, when you need it. You will need a follow up appointment in 1 years.  Please call our office 2 months in advance to schedule this appointment.  You may see Buford Dresser, MD or one of the following Advanced Practice Providers on your designated Care Team:   Rosaria Ferries, PA-C . Jory Sims, DNP, ANP      Medication  Adjustments/Labs and Tests Ordered: Current medicines are reviewed at length with the patient today.  Concerns regarding medicines are outlined above.   Tests Ordered: No orders of the defined types were placed in this encounter.   Medication Changes: No orders of the defined types were placed in this encounter.   Disposition:  Follow up 1 year or sooner PRN  Signed, Buford Dresser, MD  12/03/2018 9:20 AM    [place]

## 2018-12-29 ENCOUNTER — Ambulatory Visit (INDEPENDENT_AMBULATORY_CARE_PROVIDER_SITE_OTHER): Payer: BC Managed Care – PPO | Admitting: Family Medicine

## 2018-12-29 ENCOUNTER — Other Ambulatory Visit: Payer: Self-pay

## 2018-12-29 ENCOUNTER — Encounter: Payer: Self-pay | Admitting: Family Medicine

## 2018-12-29 VITALS — BP 112/73 | HR 78 | Temp 98.5°F | Ht 64.0 in | Wt 190.4 lb

## 2018-12-29 DIAGNOSIS — R350 Frequency of micturition: Secondary | ICD-10-CM | POA: Insufficient documentation

## 2018-12-29 DIAGNOSIS — K219 Gastro-esophageal reflux disease without esophagitis: Secondary | ICD-10-CM | POA: Diagnosis not present

## 2018-12-29 LAB — POCT URINALYSIS DIP (MANUAL ENTRY)
Bilirubin, UA: NEGATIVE
Blood, UA: NEGATIVE
Glucose, UA: NEGATIVE mg/dL
Ketones, POC UA: NEGATIVE mg/dL
Leukocytes, UA: NEGATIVE
Nitrite, UA: NEGATIVE
Protein Ur, POC: NEGATIVE mg/dL
Spec Grav, UA: 1.01 (ref 1.010–1.025)
Urobilinogen, UA: 0.2 E.U./dL
pH, UA: 5.5 (ref 5.0–8.0)

## 2018-12-29 MED ORDER — OMEPRAZOLE 40 MG PO CPDR: DELAYED_RELEASE_CAPSULE | ORAL | 1 refills | Status: DC

## 2018-12-29 NOTE — Progress Notes (Signed)
Acute Office Visit  Subjective:    Patient ID: Angel Bennett, female    DOB: 1965-08-02, 53 y.o.   MRN: [id number]  Chief Complaint  Patient presents with  . Abdominal Pain    ongoing on for a while. getting worse 2-3 months   . Gas    history of H.pyoli   . Constipation    HPI Patient is in today for abdominal pain noted for several months Pt takes pepcid ac once a day.  Pt states sometimes it makes it feel better. Pt states she has tried Tums.  No nausea or vomiting.  Pt has woke with bad taste in the throat.  Pt with constipation-ongoing. Pt states 1 week with no BM.  Pt states 3-4 days with distension.  Pt with colonosocpy in distant past. Gallbladder surgery 2014.  C section for HTN-emergency.  +FH -ulcer disease-brother Pt with h/o h pylori with medications given. Pt took medication for treatment. Pt states she did not have additional treatment. Pt with no ulcer disease. Pt with h/o colitis greater than 10 years ago Pt states yogurt improves symptoms.   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 Pt took omeprazole , Nexium and protonix. Pt took omeprazole  20mg  last night 2/20 labwork reviewed No h/o pancreatitis  Past Medical History:  Diagnosis Date  . Abdominal pain    Diffuse mild  . C. difficile colitis [id number]   Treated w PO Metronidazole  . Chest pain 05/20/2015  . Constipation, chronic   . GERD (gastroesophageal reflux disease)   . Hx of adenomatous polyp of colon 10/27/2014  . Hyperthyroidism 05/20/2015  . Palpitations 05/20/2015  . Ringing in ears    both ears    Past Surgical History:  Procedure Laterality Date  . CESAREAN SECTION  1990  . CHOLECYSTECTOMY  03/13/2011   lap single site  . COLONOSCOPY  05/15/2011   Procedure: COLONOSCOPY;  Surgeon: Landry Dyke, MD;  Location: [facility];  Service: Endoscopy;   Laterality: N/A;  . ESOPHAGOGASTRODUODENOSCOPY  05/15/2011   Procedure: ESOPHAGOGASTRODUODENOSCOPY (EGD);  Surgeon: Landry Dyke, MD;  Location: [facility];  Service: Endoscopy;  Laterality: N/A;  . HEMORRHOID SURGERY  1990    Family History  Problem Relation Age of Onset  . Diabetes Mother 53  . Hypertension Mother   . Hypertension Father     Social History   Socioeconomic History  . Marital status: Married    Spouse name: Not on file  . Number of children: Not on file  . Years of education: Not on file  . Highest education level: Not on file  Occupational History  . Not on file  Social Needs  . Financial resource strain: Not on file  . Food insecurity    Worry: Not on file    Inability: Not on file  . Transportation needs    Medical: Not on file    Non-medical: Not on file  Tobacco Use  . Smoking status: Never Smoker  . Smokeless tobacco: Never Used  Substance and Sexual Activity  . Alcohol use: No  . Drug use: No  . Sexual activity: Yes    Birth control/protection: None  Lifestyle  . Physical activity    Days per week: Not on file    Minutes per session: Not on file  . Stress: Not on file  Relationships  . Social connections  Talks on phone: Not on file    Gets together: Not on file    Attends religious service: Not on file    Active member of club or organization: Not on file    Attends meetings of clubs or organizations: Not on file    Relationship status: Not on file  . Intimate partner violence    Fear of current or ex partner: Not on file    Emotionally abused: Not on file    Physically abused: Not on file    Forced sexual activity: Not on file  Other Topics Concern  . Not on file  Social History Narrative   She is married she has 4 sons and one daughter. 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 Updated 10/26/2014         Epworth Sleepiness Scale = 9 (as of 05/19/15)    Outpatient Medications Prior to Visit   Medication Sig Dispense Refill  . fluticasone (FLONASE) 50 MCG/ACT nasal spray Place 1 spray into both nostrils 2 (two) times daily. 16 g 6  . ibuprofen (ADVIL) 600 MG tablet Take 600 mg by mouth every 4 (four) hours as needed.     . methocarbamol (ROBAXIN) 500 MG tablet Take 500 mg by mouth every 6 (six) hours as needed.     . montelukast (SINGULAIR) 10 MG tablet Take 1 tablet (10 mg total) by mouth at bedtime. 30 tablet 6  . predniSONE (DELTASONE) 5 MG tablet Take 5 mg by mouth as needed.      No facility-administered medications prior to visit.     No Known Allergies  Review of Systems  Constitutional: Negative for chills and fever.  Cardiovascular: Negative for palpitations.  Gastrointestinal: Positive for abdominal pain, constipation, heartburn and nausea.  Genitourinary: Positive for frequency.       Objective:    Physical Exam  Constitutional: She appears well-developed and well-nourished.  HENT:  Head: Normocephalic and atraumatic.  Mouth/Throat: Oropharynx is clear and moist. No oropharyngeal exudate.  Eyes: Conjunctivae are normal.  Cardiovascular: Normal rate and regular rhythm.  Pulmonary/Chest: Effort normal and breath sounds normal.  Abdominal: She exhibits distension. There is abdominal tenderness. There is no rebound and no guarding.    BP 112/73 (BP Location: Right Arm, Patient Position: Sitting, Cuff Size: Large)   Pulse 78   Temp 98.5 F (36.9 C) (Oral)   Ht 5\' 4"  (1.626 m)   Wt 190 lb 6.4 oz (86.4 kg)   SpO2 97%   BMI 32.68 kg/m  Wt Readings from Last 3 Encounters:  12/29/18 190 lb 6.4 oz (86.4 kg)  08/21/18 191 lb (86.6 kg)  04/23/18 194 lb (88 kg)    Health Maintenance Due  Topic Date Due  . HIV Screening  12/10/1981  . MAMMOGRAM  07/29/2018  . PAP SMEAR-Modifier  02/22/2019      Lab Results  Component Value Date   TSH 0.413 (L) 08/21/2018   Lab Results  Component Value Date   WBC 5.2 08/21/2018   HGB 14.7 08/21/2018   HCT 44.2  08/21/2018   MCV 89 08/21/2018   PLT 259 08/21/2018   Lab Results  Component Value Date   NA 138 08/21/2018   K 4.3 08/21/2018   CO2 21 08/21/2018   GLUCOSE 96 08/21/2018   BUN 9 08/21/2018   CREATININE 0.67 08/21/2018   BILITOT 0.3 08/21/2018   ALKPHOS 132 (H) 08/21/2018   AST 26 08/21/2018   ALT 15 08/21/2018  PROT 8.0 08/21/2018   ALBUMIN 4.6 08/21/2018   CALCIUM 10.0 08/21/2018   Lab Results  Component Value Date   CHOL 150 08/21/2018   Lab Results  Component Value Date   HDL 51 08/21/2018   Lab Results  Component Value Date   LDLCALC 82 08/21/2018   Lab Results  Component Value Date   TRIG 85 08/21/2018   Lab Results  Component Value Date   CHOLHDL 2.9 08/21/2018       Assessment & Plan:   1. Gastroesophageal reflux disease, esophagitis presence not specified omeprazole BID-rx - POCT urinalysis dipstick labwork normal-h/o H pylori-treated in the past, 2012 scope-pt does not remember being re-treated post biopsy Increase stress-pt lost job 2 weeks ago-planning to return to [place] - Ambulatory referral to Gastroenterology  2. Frequency of urination - POCT urinalysis dipstick-normal - Ambulatory referral to Gastroenterology  Elizabella Nolet Hannah Beat, MD

## 2018-12-29 NOTE — Patient Instructions (Addendum)
     If you have lab work done today you will be contacted with your lab results within the next 2 weeks.  If you have not heard from [place] then please contact us. The fastest way to get your results is to register for My Chart. Pt to see gastroenterologist Take prevacid 30mg  BID to block acid production Avoid caffeine   IF you received an x-ray today, you will receive an invoice from [facility]. Please contact [facility] at [phone] with questions or concerns regarding your invoice.   IF you received labwork today, you will receive an invoice from [facility]. Please contact [facility] at [phone] with questions or concerns regarding your invoice.   Our billing staff will not be able to assist you with questions regarding bills from these companies.  You will be contacted with the lab results as soon as they are available. The fastest way to get your results is to activate your My Chart account. Instructions are located on the last page of this paperwork. If you have not heard from [place] regarding the results in 2 weeks, please contact this office.

## 2019-01-05 ENCOUNTER — Other Ambulatory Visit: Payer: Self-pay | Admitting: Otolaryngology

## 2019-01-05 DIAGNOSIS — J329 Chronic sinusitis, unspecified: Secondary | ICD-10-CM

## 2019-01-12 ENCOUNTER — Telehealth: Payer: Self-pay | Admitting: General Surgery

## 2019-01-12 NOTE — Telephone Encounter (Signed)
Covid-19 screening questions   Do you now or have you had a fever in the last 14 days? NO  Do you have any respiratory symptoms of shortness of breath or cough now or in the last 14 days?NO   Do you have any family members or close contacts with diagnosed or suspected Covid-19 in the past 14 days? NO  Have you been tested for Covid-19 and found to be positive? NO   Patient informed to wear a mask to the appointment. Patient verbalized understanding,

## 2019-01-13 ENCOUNTER — Other Ambulatory Visit: Payer: Self-pay

## 2019-01-13 ENCOUNTER — Ambulatory Visit (INDEPENDENT_AMBULATORY_CARE_PROVIDER_SITE_OTHER): Payer: BC Managed Care – PPO | Admitting: Gastroenterology

## 2019-01-13 ENCOUNTER — Encounter: Payer: Self-pay | Admitting: Gastroenterology

## 2019-01-13 VITALS — BP 112/66 | HR 72 | Temp 97.7°F | Ht 62.75 in | Wt 190.1 lb

## 2019-01-13 DIAGNOSIS — K219 Gastro-esophageal reflux disease without esophagitis: Secondary | ICD-10-CM

## 2019-01-13 DIAGNOSIS — R140 Abdominal distension (gaseous): Secondary | ICD-10-CM

## 2019-01-13 DIAGNOSIS — R1013 Epigastric pain: Secondary | ICD-10-CM | POA: Insufficient documentation

## 2019-01-13 DIAGNOSIS — K5900 Constipation, unspecified: Secondary | ICD-10-CM | POA: Diagnosis not present

## 2019-01-13 NOTE — Patient Instructions (Signed)
If you are age 52 or older, your body mass index should be between 23-30. Your Body mass index is 33.95 kg/m. If this is out of the aforementioned range listed, please consider follow up with your Primary Care Provider.  If you are age 62 or younger, your body mass index should be between 19-25. Your Body mass index is 33.95 kg/m. If this is out of the aformentioned range listed, please consider follow up with your Primary Care Provider.   You have been scheduled for an endoscopy. Please follow written instructions given to you at your visit today. If you use inhalers (even only as needed), please bring them with you on the day of your procedure. Your physician has requested that you go to www.startemmi.com and enter the access code given to you at your visit today. This web site gives a general overview about your procedure. However, you should still follow specific instructions given to you by our office regarding your preparation for the procedure.  Take Miralax daily.  Thank you for choosing me and [facility].   Alonza Bogus, PA-C

## 2019-01-13 NOTE — Progress Notes (Signed)
01/13/2019 Angel Bennett [id number] 1966/04/12   HISTORY OF PRESENT ILLNESS: This is a 53 year old female who is a patient of Dr. Celesta Aver.  She has history of H. pylori and history of chronic constipation.  Her last EGD was in November 2012 at which time she was found to have mild gastritis and was H. pylori positive.  She was treated with medication says she completed the treatment but was never checked to see if she had gotten rid of it.  She presents her office today at the request of Dr. Benny Lennert, for evaluation regarding epigastric abdominal pain, reflux, and bloating despite taking omeprazole 40 mg twice daily.  Complains of epigastric and upper abdominal fullness.  Feels like her food just sits there for a long time.  She also reports constipation, but after review of her chart this seems to be a chronic issue and she agrees.  She tells me that she had taken MiraLAX in the past and it did seem to work, but she just discontinued it after some time.   Past Medical History:  Diagnosis Date  . Abdominal pain    Diffuse mild  . C. difficile colitis [id number]   Treated w PO Metronidazole  . Chest pain 05/20/2015  . Constipation, chronic   . GERD (gastroesophageal reflux disease)   . H. pylori infection   . Hx of adenomatous polyp of colon 10/27/2014  . Hyperthyroidism 05/20/2015  . Palpitations 05/20/2015  . Ringing in ears    both ears   Past Surgical History:  Procedure Laterality Date  . CESAREAN SECTION  1990  . CHOLECYSTECTOMY  03/13/2011   lap single site  . COLONOSCOPY  05/15/2011   Procedure: COLONOSCOPY;  Surgeon: Landry Dyke, MD;  Location: [facility];  Service: Endoscopy;  Laterality: N/A;  . ESOPHAGOGASTRODUODENOSCOPY  05/15/2011   Procedure: ESOPHAGOGASTRODUODENOSCOPY (EGD);  Surgeon: Landry Dyke, MD;  Location: [facility];  Service: Endoscopy;  Laterality: N/A;  . [place]    reports that she has never smoked. She has never used  smokeless tobacco. She reports that she does not drink alcohol or use drugs. family history includes Diabetes (age of onset: 45) in her mother; Hypertension in her father and mother. No Known Allergies    Outpatient Encounter Medications as of 01/13/2019  Medication Sig  . fluticasone (FLONASE) 50 MCG/ACT nasal spray Place 1 spray into both nostrils 2 (two) times daily.  Marland Kitchen omeprazole (PRILOSEC) 40 MG capsule Take one po BID   No facility-administered encounter medications on file as of 01/13/2019.      REVIEW OF SYSTEMS  : All other systems reviewed and negative except where noted in the History of Present Illness.   PHYSICAL EXAM: BP 112/66 (BP Location: Left Arm, Patient Position: Sitting, Cuff Size: Normal)   Pulse 72   Temp 97.7 F (36.5 C)   Ht 5' 2.75" (1.594 m) Comment: height measured without shoes  Wt 190 lb 2 oz (86.2 kg)   BMI 33.95 kg/m  General: Well developed female in no acute distress Head: Normocephalic and atraumatic Eyes:  Sclerae anicteric, conjunctiva pink. Ears: Normal auditory acuity  Lungs: Clear throughout to auscultation; no increased WOB. Heart: Regular rate and rhythm; no M/R/G Abdomen: Soft, non-distended.  BS present.  Mild epigastric TTP. Musculoskeletal: Symmetrical with no gross deformities  Skin: No lesions on visible extremities Extremities: No edema  Neurological: Alert oriented x 4, grossly non-focal Psychological:  Alert and cooperative. Normal mood and  affect  ASSESSMENT AND PLAN: *GERD/epi pain/history of Hpylori/upper abdominal fullness/bloating:  Eradication of Hpylori never confirmed after treatment.  Still with symptoms despite omeprazole 40 mg BID.  Patient requesting repeat EGD.  Will schedule with Dr. Carlean Purl.  Can check for Hpylori again at that time.  ? Delayed gastric emptying and need for GES pending results of EGD. *Chronic constipation:  Had relief with Miralax in the past.  Will restart.  **The risks, benefits, and  alternatives to EGD were discussed with the patient and she consents to proceed.   CC:  Rutherford Guys, MD CC:  Dr. Benny Lennert

## 2019-01-14 ENCOUNTER — Ambulatory Visit
Admission: RE | Admit: 2019-01-14 | Discharge: 2019-01-14 | Disposition: A | Discharge status: Home / Self Care | Payer: BC Managed Care – PPO | Source: Ambulatory Visit | Attending: Otolaryngology | Admitting: Otolaryngology

## 2019-01-14 DIAGNOSIS — J329 Chronic sinusitis, unspecified: Secondary | ICD-10-CM

## 2019-01-21 ENCOUNTER — Telehealth: Payer: Self-pay | Admitting: Internal Medicine

## 2019-01-21 NOTE — Telephone Encounter (Signed)

## 2019-01-22 ENCOUNTER — Ambulatory Visit (AMBULATORY_SURGERY_CENTER): Payer: BC Managed Care – PPO | Admitting: Internal Medicine

## 2019-01-22 ENCOUNTER — Encounter: Payer: Self-pay | Admitting: Internal Medicine

## 2019-01-22 ENCOUNTER — Other Ambulatory Visit: Payer: Self-pay

## 2019-01-22 VITALS — BP 121/71 | HR 63 | Temp 98.5°F | Resp 21 | Ht 62.5 in | Wt 190.0 lb

## 2019-01-22 DIAGNOSIS — R1013 Epigastric pain: Secondary | ICD-10-CM

## 2019-01-22 DIAGNOSIS — K219 Gastro-esophageal reflux disease without esophagitis: Secondary | ICD-10-CM

## 2019-01-22 MED ORDER — SODIUM CHLORIDE 0.9 % IV SOLN: 500.0000 mL | Freq: Once | INTRAVENOUS | Status: DC

## 2019-01-22 NOTE — Op Note (Signed)
[facility] Patient Name: Angel Bennett Procedure Date: 01/22/2019 4:11 PM MRN: [id number] Endoscopist: Gatha Mayer , MD Age: 52 Referring MD:  Date of Birth: 1966-04-03 Gender: Female Account #: [id number] Procedure:                Upper GI endoscopy Indications:              Epigastric abdominal pain Medicines:                Propofol per Anesthesia, Monitored Anesthesia Care Procedure:                Pre-Anesthesia Assessment:                           - Prior to the procedure, a History and Physical                            was performed, and patient medications and                            allergies were reviewed. The patient's tolerance of                            previous anesthesia was also reviewed. The risks                            and benefits of the procedure and the sedation                            options and risks were discussed with the patient.                            All questions were answered, and informed consent                            was obtained. Prior Anticoagulants: The patient has                            taken no previous anticoagulant or antiplatelet                            agents. ASA Grade Assessment: II - A patient with                            mild systemic disease. After reviewing the risks                            and benefits, the patient was deemed in                            satisfactory condition to undergo the procedure.                           After obtaining informed consent, the endoscope was  passed under direct vision. Throughout the                            procedure, the patient's blood pressure, pulse, and                            oxygen saturations were monitored continuously. The                            Endoscope was introduced through the mouth, and                            advanced to the second part of duodenum. The upper                            GI  endoscopy was accomplished without difficulty.                            The patient tolerated the procedure well. Scope In: Scope Out: Findings:                 The esophagus was normal.                           The stomach was normal.                           The examined duodenum was normal.                           The cardia and gastric fundus were normal on                            retroflexion. Complications:            No immediate complications. Estimated Blood Loss:     Estimated blood loss: none. Impression:               - Normal esophagus.                           - Normal stomach.                           - Normal examined duodenum.                           - No specimens collected. Recommendation:           - Patient has a contact number available for                            emergencies. The signs and symptoms of potential                            delayed complications were discussed with the  patient. Return to normal activities tomorrow.                            Written discharge instructions were provided to the                            patient.                           - Resume previous diet.                           - Continue present medications.                           - Add Dulcolax every other night to the daily                            MiraLAx                           I think this may relieve epigastric pain and                            bloating.                           If it doesn't I can see her back.                           I did not test for H pylori as cannot trust result                            if negative while on PPI - needs to stop PPI x 2                            weeks and do a breth test or stool Ag to test but I                            do nnot think we need to do that Gatha Mayer, MD 01/22/2019 4:36:14 PM This report has been signed electronically.

## 2019-01-22 NOTE — Progress Notes (Signed)
Pt's states no medical or surgical changes since previsit or office visit.  [place]

## 2019-01-22 NOTE — Progress Notes (Signed)
PT taken to PACU. Monitors in place. VSS. Report given to RN. 

## 2019-01-22 NOTE — Patient Instructions (Addendum)
The esophagus and stomach and duodenum (upper small intestine) all look ok.  I think if you move your bowels better you will feel better.  Stay on the M\iraLAx but also take a Dulcolax 5 mg tablet every other night  If that does not help come back to see me  I appreciate the opportunity to care for you. Gatha Mayer, MD, FACG    YOU HAD AN ENDOSCOPIC PROCEDURE TODAY AT [facility]:   Refer to the procedure report that was given to you for any specific questions about what was found during the examination.  If the procedure report does not answer your questions, please call your gastroenterologist to clarify.  If you requested that your care partner not be given the details of your procedure findings, then the procedure report has been included in a sealed envelope for you to review at your convenience later.  YOU SHOULD EXPECT: Some feelings of bloating in the abdomen. Passage of more gas than usual.  Walking can help get rid of the air that was put into your GI tract during the procedure and reduce the bloating. If you had a lower endoscopy (such as a colonoscopy or flexible sigmoidoscopy) you may notice spotting of blood in your stool or on the toilet paper. If you underwent a bowel prep for your procedure, you may not have a normal bowel movement for a few days.  Please Note:  You might notice some irritation and congestion in your nose or some drainage.  This is from the oxygen used during your procedure.  There is no need for concern and it should clear up in a day or so.  SYMPTOMS TO REPORT IMMEDIATELY:    Following upper endoscopy (EGD)  Vomiting of blood or coffee ground material  New chest pain or pain under the shoulder blades  Painful or persistently difficult swallowing  New shortness of breath  Fever of 100F or higher  Black, tarry-looking stools  For urgent or emergent issues, a gastroenterologist can be reached at any hour by calling [phone]  [phone].   DIET:  We do recommend a small meal at first, but then you may proceed to your regular diet.  Drink plenty of fluids but you should avoid alcoholic beverages for 24 hours.  ACTIVITY:  You should plan to take it easy for the rest of today and you should NOT DRIVE or use heavy machinery until tomorrow (because of the sedation medicines used during the test).    FOLLOW UP: Our staff will call the number listed on your records 48-72 hours following your procedure to check on you and address any questions or concerns that you may have regarding the information given to you following your procedure. If we do not reach you, we will leave a message.  We will attempt to reach you two times.  During this call, we will ask if you have developed any symptoms of COVID 19. If you develop any symptoms (ie: fever, flu-like symptoms, shortness of breath, cough etc.) before then, please call [phone].  If you test positive for Covid 19 in the 2 weeks post procedure, please call and report this information to [place].    If any biopsies were taken you will be contacted by phone or by letter within the next 1-3 weeks.  Please call us at [phone] if you have not heard about the biopsies in 3 weeks.    SIGNATURES/CONFIDENTIALITY: You and/or your care partner have signed paperwork  which will be entered into your electronic medical record.  These signatures attest to the fact that that the information above on your After Visit Summary has been reviewed and is understood.  Full responsibility of the confidentiality of this discharge information lies with you and/or your care-partner.

## 2019-01-26 ENCOUNTER — Telehealth: Payer: Self-pay

## 2019-01-26 NOTE — Telephone Encounter (Signed)
  Follow up Call-  Call back number 01/22/2019  Post procedure Call Back phone  # [phone]  Permission to leave phone message Yes  Some recent data might be hidden     Patient questions:  Do you have a fever, pain , or abdominal swelling? No. Pain Score  0 *  Have you tolerated food without any problems? Yes.    Have you been able to return to your normal activities? Yes.    Do you have any questions about your discharge instructions: Diet   No. Medications  No. Follow up visit  No.  Do you have questions or concerns about your Care? No.  Actions: * If pain score is 4 or above: No action needed, pain <4.  1. Have you developed a fever since your procedure? no  2.   Have you had an respiratory symptoms (SOB or cough) since your procedure? no  3.   Have you tested positive for COVID 19 since your procedure no  4.   Have you had any family members/close contacts diagnosed with the COVID 19 since your procedure?  no   If yes to any of these questions please route to Joylene John, RN and Alphonsa Gin, Therapist, sports.

## 2019-02-02 ENCOUNTER — Other Ambulatory Visit: Payer: Self-pay

## 2019-02-02 DIAGNOSIS — Z20822 Contact with and (suspected) exposure to covid-19: Secondary | ICD-10-CM

## 2019-02-03 LAB — NOVEL CORONAVIRUS, NAA: SARS-CoV-2, NAA: NOT DETECTED

## 2019-02-04 NOTE — Telephone Encounter (Signed)
Yes, Dr. Margaretann Loveless read it the day it was performed. We discussed it at her initial consult several days later. It should be available in her mychart test results if I am not mistaken if she is looking for the actual report.

## 2019-09-19 ENCOUNTER — Encounter: Payer: Self-pay | Admitting: Internal Medicine

## 2020-01-07 ENCOUNTER — Encounter: Payer: Self-pay | Admitting: Cardiology

## 2020-04-19 IMAGING — CT CT MAXILLOFACIAL WITHOUT CONTRAST
1 series · 15 of 30 positions shown, 19 images · non-contrast
Comparison: None.

CLINICAL DATA: Sinus pressure and pain. Swelling of the maxillary
region.

EXAM:
CT MAXILLOFACIAL WITHOUT CONTRAST
TECHNIQUE: Multidetector CT images of the paranasal sinuses were obtained using
the standard protocol without intravenous contrast.

[Series 4: soft tissue · axial · 0.34mm/px · z∈[-147,-22]mm · 15 of 135 slices shown, 19 images]
[im 5/135  brain]
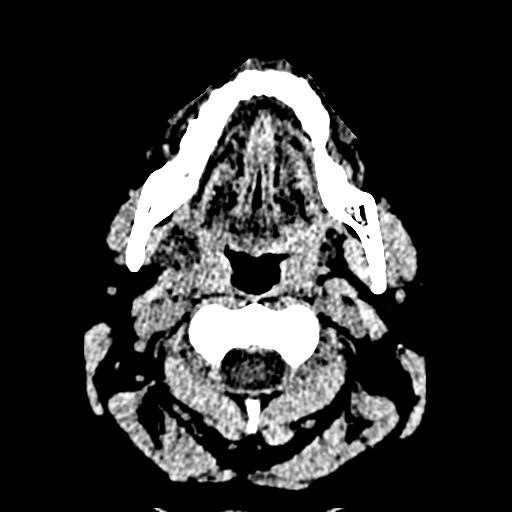
[im 5/135  bone]
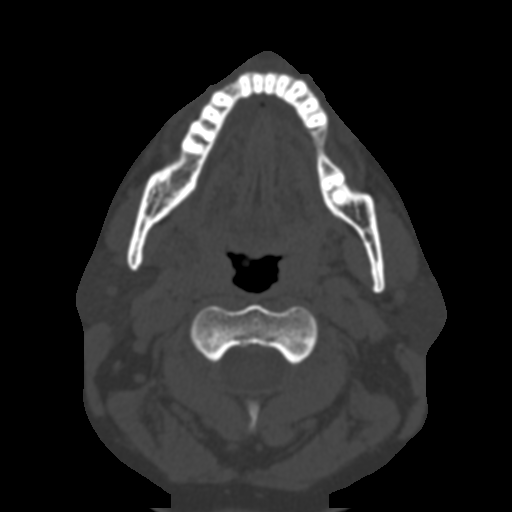
[im 14/135  bone]
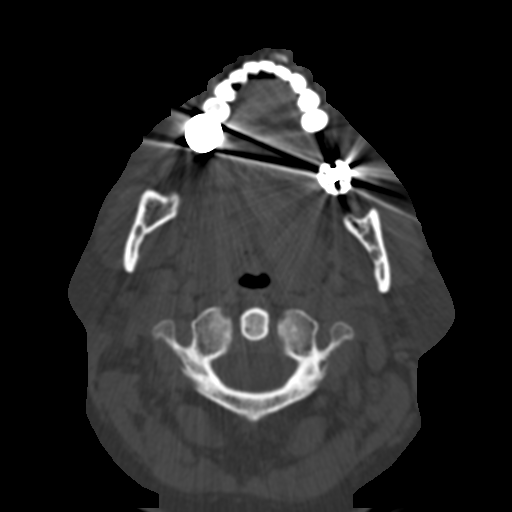
[im 24/135  bone]
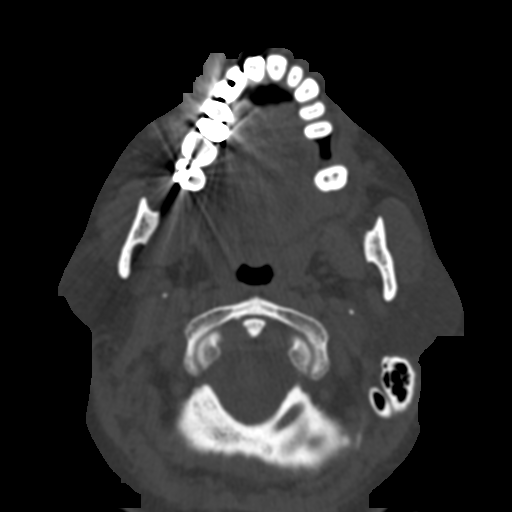
[im 33/135  bone]
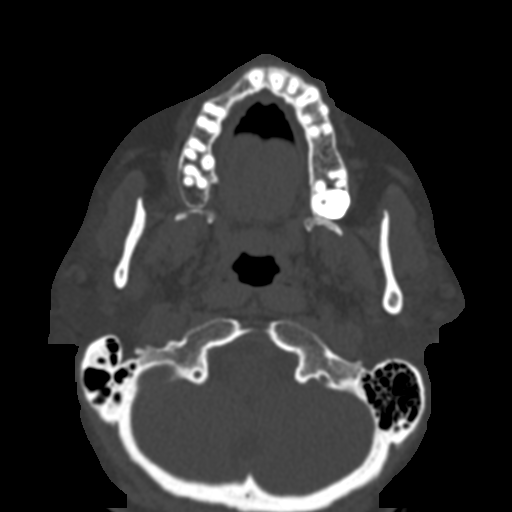
[im 42/135  brain]
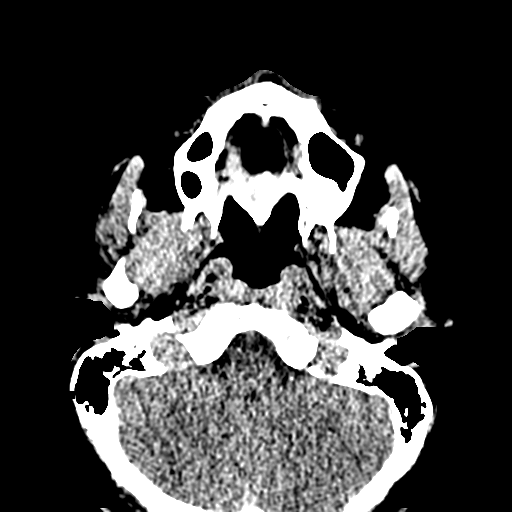
[im 42/135  bone]
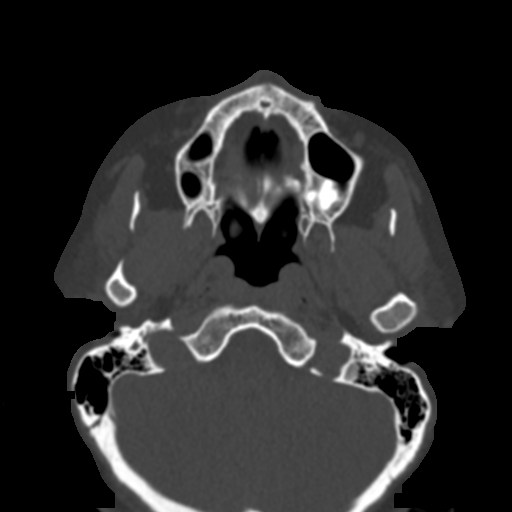
[im 51/135  bone]
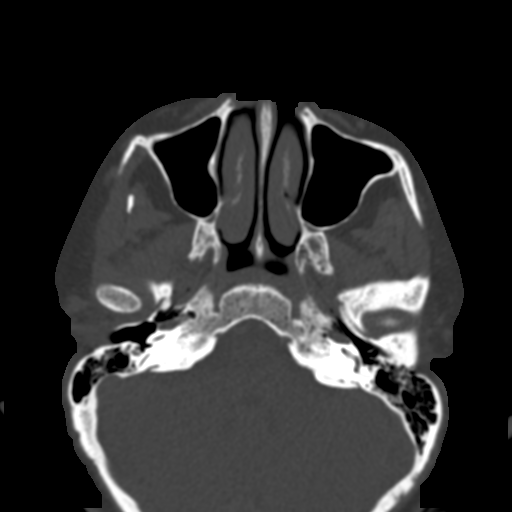
[im 61/135  bone]
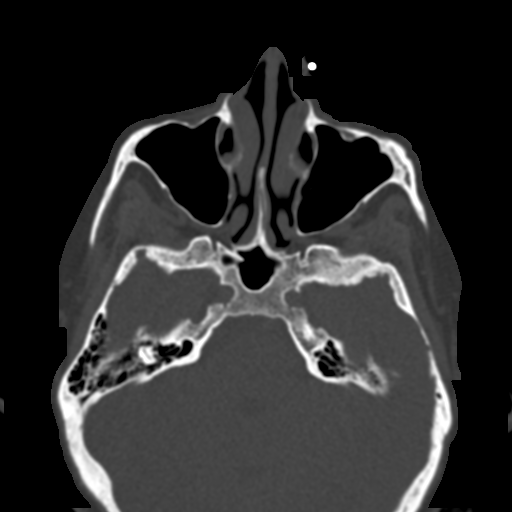
[im 70/135  bone]
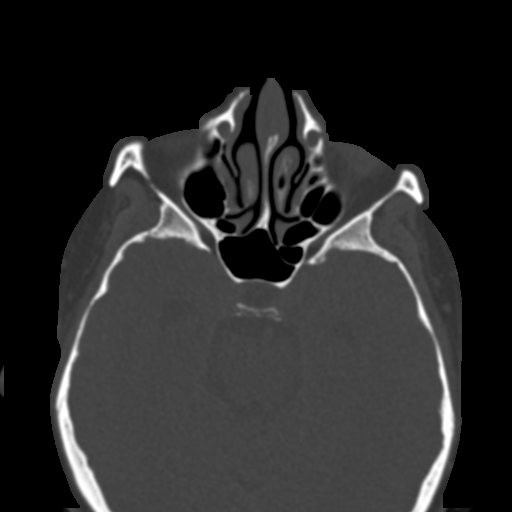
[im 74/135  brain]
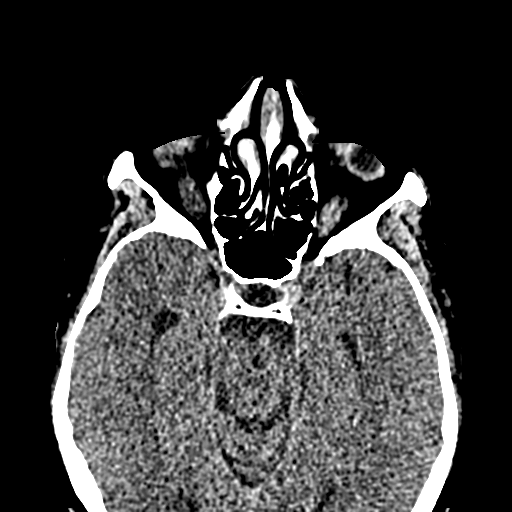
[im 74/135  bone]
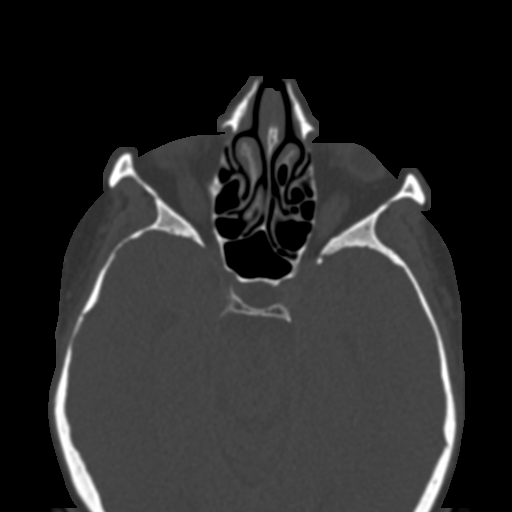
[im 84/135  bone]
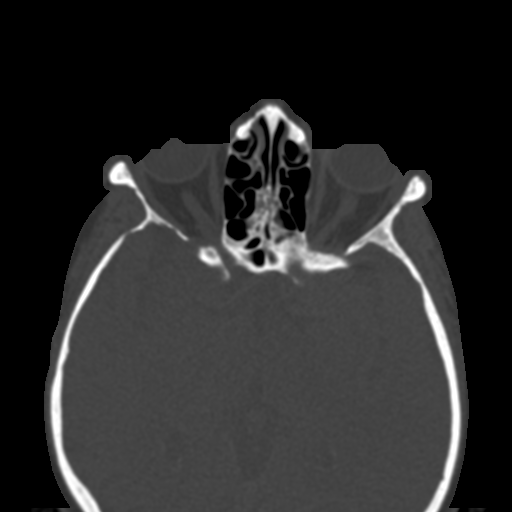
[im 93/135  bone]
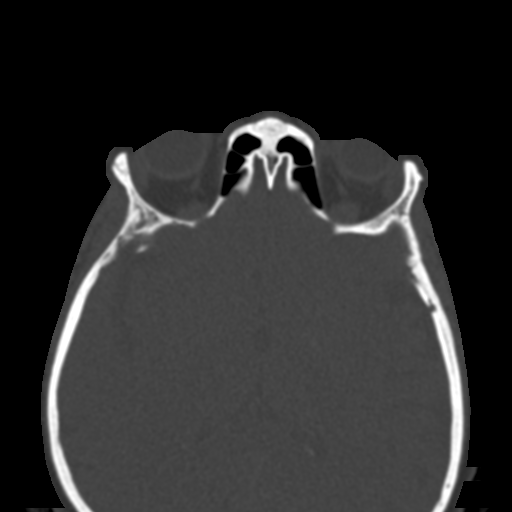
[im 102/135  bone]
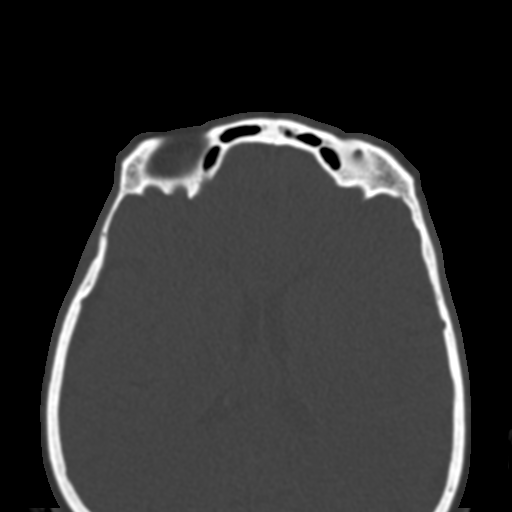
[im 111/135  brain]
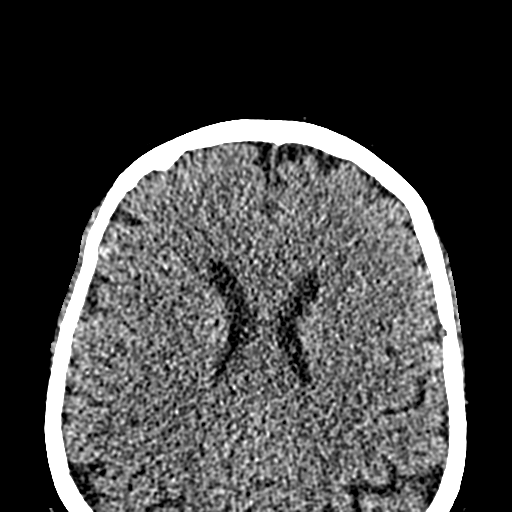
[im 111/135  bone]
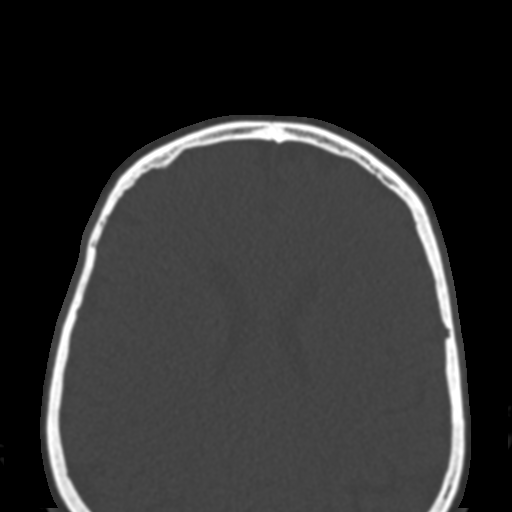
[im 121/135  bone]
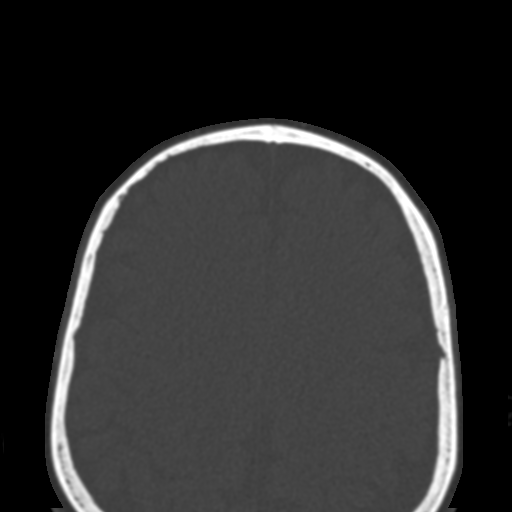
[im 130/135  bone]
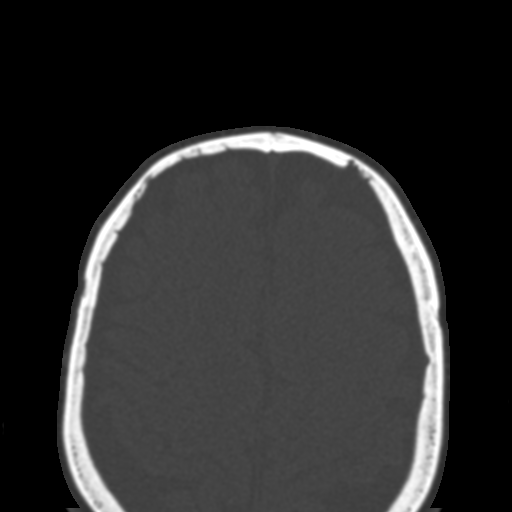

[15 of 30 positions shown; findings below may reference images not displayed]

FINDINGS: Paranasal sinuses:

Frontal: Normally aerated. Patent frontal sinus drainage pathways.

Ethmoid: Normally aerated.

Maxillary: Normally aerated.

Sphenoid: Normally aerated. Patent sphenoethmoidal recesses.

Right ostiomeatal unit: Patent.

Left ostiomeatal unit: Patent.

Nasal passages: Patent. Intact nasal septum is midline.

Anatomy: Minor pneumatization superior to the anterior ethmoid notch
on the left. Symmetric and intact olfactory grooves and fovea
ethmoidalis, Keros II (4-7mm) Presellar sphenoid pneumatization
pattern. No dehiscence of carotid or optic canals. No onodi cell.

Other: Unerupted left maxillary molar but without active or
problematic finding. Chronic dental and periodontal disease
elsewhere but without active component.
IMPRESSION: No evidence of acute or chronic sinus disease. No unfavorable
anatomic variant.

Chronic dental and periodontal disease but without active component.
Chronic unerupted left maxillary molar.

## 2020-04-25 ENCOUNTER — Ambulatory Visit: Payer: Self-pay | Admitting: Physician Assistant

## 2020-04-25 VITALS — BP 125/73 | HR 60 | Temp 98.2°F | Resp 18 | Ht 64.0 in | Wt 194.0 lb

## 2020-04-25 DIAGNOSIS — M25562 Pain in left knee: Secondary | ICD-10-CM

## 2020-04-25 DIAGNOSIS — Z13220 Encounter for screening for lipoid disorders: Secondary | ICD-10-CM

## 2020-04-25 DIAGNOSIS — G8929 Other chronic pain: Secondary | ICD-10-CM

## 2020-04-25 DIAGNOSIS — Z114 Encounter for screening for human immunodeficiency virus [HIV]: Secondary | ICD-10-CM

## 2020-04-25 DIAGNOSIS — M79672 Pain in left foot: Secondary | ICD-10-CM

## 2020-04-25 DIAGNOSIS — K219 Gastro-esophageal reflux disease without esophagitis: Secondary | ICD-10-CM

## 2020-04-25 DIAGNOSIS — M25561 Pain in right knee: Secondary | ICD-10-CM

## 2020-04-25 DIAGNOSIS — E0590 Thyrotoxicosis, unspecified without thyrotoxic crisis or storm: Secondary | ICD-10-CM

## 2020-04-25 DIAGNOSIS — Z5989 Other problems related to housing and economic circumstances: Secondary | ICD-10-CM

## 2020-04-25 DIAGNOSIS — M25572 Pain in left ankle and joints of left foot: Secondary | ICD-10-CM

## 2020-04-25 DIAGNOSIS — Z1159 Encounter for screening for other viral diseases: Secondary | ICD-10-CM

## 2020-04-25 MED ORDER — MELOXICAM 15 MG PO TABS: 15.0000 mg | ORAL_TABLET | Freq: Every day | ORAL | 0 refills | Status: DC

## 2020-04-25 NOTE — Progress Notes (Signed)
Patient shares 10 months ago she had xrays confirming a fracture. 4 months ago patient fell and hit left knee and complains of swelling Patient has not taken medication today. Patient has eaten today. Patient is more present at night.

## 2020-04-25 NOTE — Patient Instructions (Signed)
Please return to the mobile unit tomorrow morning for fasting labs  Please take the mobic as needed for your foot pain.  We will give [facility] as well as the information for financial assistance in the morning when you return for your fasting labs.  Kennieth Rad, PA-C Physician Assistant [facility] [email or web address]    Ankle Pain The ankle joint holds your body weight and allows you to move around. Ankle pain can occur on either side or the back of one ankle or both ankles. Ankle pain may be sharp and burning or dull and aching. There may be tenderness, stiffness, redness, or warmth around the ankle. Many things can cause ankle pain, including an injury to the area and overuse of the ankle. Follow these instructions at home: Activity  Rest your ankle as told by your health care provider. Avoid any activities that cause ankle pain.  Do not use the injured limb to support your body weight until your health care provider says that you can. Use crutches as told by your health care provider.  Do exercises as told by your health care provider.  Ask your health care provider when it is safe to drive if you have a brace on your ankle. If you have a brace:  Wear the brace as told by your health care provider. Remove it only as told by your health care provider.  Loosen the brace if your toes tingle, become numb, or turn cold and blue.  Keep the brace clean.  If the brace is not waterproof: ? Do not let it get wet. ? Cover it with a watertight covering when you take a bath or shower. If you were given an elastic bandage:   Remove it when you take a bath or a shower.  Try not to move your ankle very much, but wiggle your toes from time to time. This helps to prevent swelling.  Adjust the bandage to make it more comfortable if it feels too tight.  Loosen the bandage if you have  numbness or tingling in your foot or if your foot turns cold and blue. Managing pain, stiffness, and swelling   If directed, put ice on the painful area. ? If you have a removable brace or elastic bandage, remove it as told by your health care provider. ? Put ice in a plastic bag. ? Place a towel between your skin and the bag. ? Leave the ice on for 20 minutes, 2-3 times a day.  Move your toes often to avoid stiffness and to lessen swelling.  Raise (elevate) your ankle above the level of your heart while you are sitting or lying down. General instructions  Record information about your pain. Writing down the following may be helpful for you and your health care provider: ? How often you have ankle pain. ? Where the pain is located. ? What the pain feels like.  If treatment involves wearing a prescribed shoe or insole, make sure you wear it correctly and for as long as told by your health care provider.  Take over-the-counter and prescription medicines only as told by your health care provider.  Keep all follow-up visits as told by your health care provider. This is important. Contact a health care provider if:  Your pain gets worse.  Your pain is not relieved with medicines.  You have a fever or chills.  You are having more trouble with walking.  You  have new symptoms. Get help right away if:  Your foot, leg, toes, or ankle: ? Tingles or becomes numb. ? Becomes swollen. ? Turns pale or blue. Summary  Ankle pain can occur on either side or the back of one ankle or both ankles.  Ankle pain may be sharp and burning or dull and aching.  Rest your ankle as told by your health care provider. If told, apply ice to the area.  Take over-the-counter and prescription medicines only as told by your health care provider. This information is not intended to replace advice given to you by your health care provider. Make sure you discuss any questions you have with your health care  provider. Document Revised: 10/06/2018 Document Reviewed: 12/24/2017 Elsevier Patient Education  [employer].

## 2020-04-25 NOTE — Progress Notes (Signed)
New Patient Office Visit  Subjective:  Patient ID: Angel Bennett, female    DOB: [birth year]  Age: 54 y.o. MRN: [id number]  CC: No chief complaint on file.   HPI Angel Bennett reports that she has been having pain and swelling in her left ankle for the last 4 months.  Reports she tripped on the stairs, describes pain and swelling is intermittent, no previous imaging, has used Tylenol and ibuprofen without relief.  Endorses significant history of bilateral knee pain, states that she was last seen for this in [place] approximately 10 months ago, states that she was given injections in both knees with some relief, states it helped for approximately 4 months.  Also endorses history of injections in her ankles.  Does endorse history of heartburn and acid reflux, well controlled with daily Pepcid use  Past Medical History:  Diagnosis Date   Abdominal pain    Diffuse mild   C. difficile colitis [id number]   Treated w PO Metronidazole   Chest pain 05/20/2015   Constipation, chronic    GERD (gastroesophageal reflux disease)    H. pylori infection    Hx of adenomatous polyp of colon 10/27/2014   Hyperthyroidism 05/20/2015   Palpitations 05/20/2015   Partial small bowel obstruction ([place]) 04/28/2013   Ringing in ears    both ears    Past Surgical History:  Procedure Laterality Date   [place]  03/13/2011   lap single site   COLONOSCOPY  05/15/2011   Procedure: COLONOSCOPY;  Surgeon: Landry Dyke, MD;  Location: [facility];  Service: Endoscopy;  Laterality: N/A;   ESOPHAGOGASTRODUODENOSCOPY  05/15/2011   Procedure: ESOPHAGOGASTRODUODENOSCOPY (EGD);  Surgeon: Landry Dyke, MD;  Location: [facility];  Service: Endoscopy;  Laterality: N/A;   HEMORRHOID SURGERY  1990    Family History  Problem Relation Age of Onset   Diabetes Mother 7   Hypertension Mother    Hypertension Father     Social History   Socioeconomic History    Marital status: Married    Spouse name: Not on file   Number of children: Not on file   Years of education: Not on file   Highest education level: Not on file  Occupational History   Not on file  Tobacco Use   Smoking status: Never Smoker   Smokeless tobacco: Never Used  Substance and Sexual Activity   Alcohol use: No   Drug use: No   Sexual activity: Yes    Birth control/protection: None  Other Topics Concern   Not on file  Social History Narrative   She is married she has 4 sons and one daughter. 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 Updated 10/26/2014         Epworth Sleepiness Scale = 9 (as of 05/19/15)   Social Determinants of Health   Financial Resource Strain:    Difficulty of Paying Living Expenses: Not on file  Food Insecurity:    Worried About Charity fundraiser in the Last Year: Not on file   [employer] of Food in the Last Year: Not on file  Transportation Needs:    Lack of Transportation (Medical): Not on file   Lack of Transportation (Non-Medical): Not on file  Physical Activity:    Days of Exercise per Week: Not on file   Minutes of Exercise per Session: Not on file  Stress:    Feeling of Stress : Not on  file  Social Connections:    Frequency of Communication with Friends and Family: Not on file   Frequency of Social Gatherings with Friends and Family: Not on file   Attends Religious Services: Not on Electrical engineer or Organizations: Not on file   Attends Archivist Meetings: Not on file   Marital Status: Not on file  Intimate Partner Violence:    Fear of Current or Ex-Partner: Not on file   Emotionally Abused: Not on file   Physically Abused: Not on file   Sexually Abused: Not on file    ROS Review of Systems  Constitutional: Negative.   HENT: Negative.   Eyes: Negative.   Respiratory: Negative.   Cardiovascular: Negative.   Gastrointestinal: Negative.    Endocrine: Negative.   Genitourinary: Negative.   Musculoskeletal: Positive for gait problem, joint swelling and myalgias.  Skin: Negative.   Allergic/Immunologic: Negative.   Hematological: Negative.   Psychiatric/Behavioral: Negative.     Objective:   Today's Vitals: There were no vitals taken for this visit.  Physical Exam Vitals and nursing note reviewed.  Constitutional:      General: She is not in acute distress.    Appearance: Normal appearance. She is not ill-appearing.  HENT:     Head: Normocephalic.     Right Ear: External ear normal.     Left Ear: External ear normal.     Nose: Nose normal.     Mouth/Throat:     Mouth: Mucous membranes are moist.     Pharynx: Oropharynx is clear.  Eyes:     Extraocular Movements: Extraocular movements intact.     Conjunctiva/sclera: Conjunctivae normal.     Pupils: Pupils are equal, round, and reactive to light.  Cardiovascular:     Rate and Rhythm: Normal rate and regular rhythm.     Pulses: Normal pulses.          Dorsalis pedis pulses are 2+ on the left side.       Posterior tibial pulses are 2+ on the left side.     Heart sounds: Normal heart sounds.  Pulmonary:     Effort: Pulmonary effort is normal.     Breath sounds: Normal breath sounds.  Musculoskeletal:     Cervical back: Normal range of motion and neck supple.     Right ankle: Normal.     Left ankle: Tenderness present. Decreased range of motion.     Left Achilles Tendon: Tenderness present.     Right foot: Normal.     Left foot: Decreased range of motion. Tenderness present. No swelling.  Skin:    General: Skin is warm and dry.  Neurological:     General: No focal deficit present.     Mental Status: She is alert and oriented to person, place, and time.  Psychiatric:        Mood and Affect: Mood normal.        Behavior: Behavior normal.        Thought Content: Thought content normal.        Judgment: Judgment normal.     Assessment & Plan:   Problem  List Items Addressed This Visit    None      Outpatient Encounter Medications as of 04/25/2020  Medication Sig   fluticasone (FLONASE) 50 MCG/ACT nasal spray Place 1 spray into both nostrils 2 (two) times daily.   omeprazole (PRILOSEC) 40 MG capsule Take one po BID   No facility-administered  encounter medications on file as of 04/25/2020.  1. Left foot pain Encouraged RICE, cautious use of Mobic, continue using Pepcid on a daily basis. - meloxicam (MOBIC) 15 MG tablet; Take 1 tablet (15 mg total) by mouth daily.  Dispense: 30 tablet; Refill: 0 - DG Foot Complete Left; Future - DG Ankle Complete Left; Future  2. Acute left ankle pain   3. Chronic pain of both knees   4. Gastroesophageal reflux disease without esophagitis  - CBC with Differential/Platelet; Future - Comp. Metabolic Panel (12); Future  5. Screening, lipid  - Lipid panel; Future  6. Screening for HIV without presence of risk factors  - HIV antibody (with reflex); Future  7. Encounter for HCV screening test for low risk patient  - HCV Ab w/Rflx to Verification; Future  8. Uninsured  Patient will be [facility], patient to return to mobile unit for fasting labs, patient will also be given financial assistance information.  9. Subclinical hyperthyroidism  - TSH; Future   I have reviewed the patient's medical history (PMH, PSH, Social History, Family History, Medications, and allergies) , and have been updated if relevant. I spent 30 minutes reviewing chart and  face to face time with patient.     Follow-up: No follow-ups on file.   Loraine Grip Mayers, PA-C

## 2020-04-26 ENCOUNTER — Telehealth: Payer: Self-pay

## 2020-04-26 ENCOUNTER — Other Ambulatory Visit: Payer: Medicaid Other | Admitting: Physician Assistant

## 2020-04-26 NOTE — Telephone Encounter (Signed)
Contacted patient by phone to reschedule today's visit to the mobile unit. Unable to LVM for patient. Reaching out via MyChart also.

## 2020-05-29 ENCOUNTER — Other Ambulatory Visit: Payer: Self-pay

## 2020-05-29 ENCOUNTER — Ambulatory Visit: Payer: Self-pay | Attending: Family Medicine

## 2020-06-01 ENCOUNTER — Telehealth: Payer: Self-pay | Admitting: General Practice

## 2020-06-01 NOTE — Telephone Encounter (Signed)
Copied from [place] [id number]. Topic: General - Other >> May 30, 2020 10:53 AM Celene Kras wrote: Reason for CRM: Pts son calling stating that he emailed all the paperwork needed for [facility]. Please advise.

## 2020-06-21 ENCOUNTER — Ambulatory Visit: Payer: Self-pay | Admitting: Obstetrics and Gynecology

## 2020-06-22 ENCOUNTER — Other Ambulatory Visit: Payer: Self-pay

## 2020-06-22 ENCOUNTER — Ambulatory Visit: Payer: Self-pay | Admitting: Physician Assistant

## 2020-06-22 ENCOUNTER — Ambulatory Visit (HOSPITAL_COMMUNITY)
Admission: RE | Admit: 2020-06-22 | Discharge: 2020-06-22 | Disposition: A | Discharge status: Home / Self Care | Payer: Medicaid Other | Source: Ambulatory Visit | Attending: Physician Assistant | Admitting: Physician Assistant

## 2020-06-22 VITALS — BP 124/76 | HR 62 | Temp 98.2°F | Resp 18 | Ht 63.0 in | Wt 193.0 lb

## 2020-06-22 DIAGNOSIS — M25562 Pain in left knee: Secondary | ICD-10-CM

## 2020-06-22 DIAGNOSIS — Z114 Encounter for screening for human immunodeficiency virus [HIV]: Secondary | ICD-10-CM

## 2020-06-22 DIAGNOSIS — M79672 Pain in left foot: Secondary | ICD-10-CM | POA: Insufficient documentation

## 2020-06-22 DIAGNOSIS — Z13220 Encounter for screening for lipoid disorders: Secondary | ICD-10-CM

## 2020-06-22 DIAGNOSIS — F411 Generalized anxiety disorder: Secondary | ICD-10-CM

## 2020-06-22 DIAGNOSIS — G8929 Other chronic pain: Secondary | ICD-10-CM

## 2020-06-22 DIAGNOSIS — M25561 Pain in right knee: Secondary | ICD-10-CM

## 2020-06-22 DIAGNOSIS — F32A Depression, unspecified: Secondary | ICD-10-CM

## 2020-06-22 DIAGNOSIS — E559 Vitamin D deficiency, unspecified: Secondary | ICD-10-CM

## 2020-06-22 DIAGNOSIS — Z23 Encounter for immunization: Secondary | ICD-10-CM

## 2020-06-22 DIAGNOSIS — Z1159 Encounter for screening for other viral diseases: Secondary | ICD-10-CM

## 2020-06-22 DIAGNOSIS — E0590 Thyrotoxicosis, unspecified without thyrotoxic crisis or storm: Secondary | ICD-10-CM

## 2020-06-22 NOTE — Progress Notes (Signed)
Patient complains of knee pain scaled at a 7. Pain is bilateral. Patient has not taken medication and patient has not eaten.

## 2020-06-22 NOTE — Progress Notes (Signed)
Established Patient Office Visit  Subjective:  Patient ID: Angel Bennett, female    DOB: [birth year]  Age: 54 y.o. MRN: [id number]  CC:  Chief Complaint  Patient presents with  . Knee Pain    HPI Angel Bennett reports that she continues to have bilateral on a daily basis, states the left ankle and foot pain has improved but is still present.  Reports that she did not get the imaging completed on her foot and ankle after last visit to the mobile medicine unit on April 25, 2020.  Reports that she has been using Mobic with some relief.  Has not tried bracing or ice.  Endorses Covid vaccine booster given on May 27, 2020, does not have vaccine card with her today.  PHQ 9 score 15, GAD-7 score 15.  Patient reports these symptoms have been present "awhile", attributes them to increased stressors.  Reports sleep is good.  Angel Bennett denies any thoughts of self-harm.    Past Medical History:  Diagnosis Date  . Abdominal pain    Diffuse mild  . C. difficile colitis [id number]   Treated w PO Metronidazole  . Chest pain 05/20/2015  . Constipation, chronic   . GERD (gastroesophageal reflux disease)   . H. pylori infection   . Hx of adenomatous polyp of colon 10/27/2014  . Hyperthyroidism 05/20/2015  . Palpitations 05/20/2015  . Partial small bowel obstruction ([place]) 04/28/2013  . Ringing in ears    both ears    Past Surgical History:  Procedure Laterality Date  . CESAREAN SECTION  1990  . CHOLECYSTECTOMY  03/13/2011   lap single site  . COLONOSCOPY  05/15/2011   Procedure: COLONOSCOPY;  Surgeon: Landry Dyke, MD;  Location: [facility];  Service: Endoscopy;  Laterality: N/A;  . ESOPHAGOGASTRODUODENOSCOPY  05/15/2011   Procedure: ESOPHAGOGASTRODUODENOSCOPY (EGD);  Surgeon: Landry Dyke, MD;  Location: [facility];  Service: Endoscopy;  Laterality: N/A;  . HEMORRHOID SURGERY  1990    Family History  Problem Relation Age of Onset  . Diabetes Mother 17  . Hypertension  Mother   . Hypertension Father     Social History   Socioeconomic History  . Marital status: Married    Spouse name: Not on file  . Number of children: Not on file  . Years of education: Not on file  . Highest education level: Not on file  Occupational History  . Not on file  Tobacco Use  . Smoking status: Never Smoker  . Smokeless tobacco: Never Used  Substance and Sexual Activity  . Alcohol use: No  . Drug use: No  . Sexual activity: Yes    Birth control/protection: None  Other Topics Concern  . Not on file  Social History Narrative   She is married she has 4 sons and one daughter. 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 Updated 10/26/2014         Epworth Sleepiness Scale = 9 (as of 05/19/15)   Social Determinants of Health   Financial Resource Strain: Not on file  Food Insecurity: Not on file  Transportation Needs: Not on file  Physical Activity: Not on file  Stress: Not on file  Social Connections: Not on file  Intimate Partner Violence: Not on file    Outpatient Medications Prior to Visit  Medication Sig Dispense Refill  . fluticasone (FLONASE) 50 MCG/ACT nasal spray Place 1 spray into both nostrils 2 (two) times daily. 16 g 6  . meloxicam (  MOBIC) 15 MG tablet Take 1 tablet (15 mg total) by mouth daily. 30 tablet 0  . omeprazole (PRILOSEC) 40 MG capsule Take one po BID 60 capsule 1   No facility-administered medications prior to visit.    No Known Allergies  ROS Review of Systems  Constitutional: Negative.   HENT: Negative.   Eyes: Negative.   Respiratory: Negative.   Cardiovascular: Negative.   Gastrointestinal: Negative.   Endocrine: Negative.   Genitourinary: Negative.   Musculoskeletal: Positive for arthralgias and gait problem.  Skin: Negative.   Allergic/Immunologic: Negative.   Hematological: Negative.   Psychiatric/Behavioral: Positive for dysphoric mood. Negative for self-injury, sleep disturbance and suicidal  ideas. The patient is nervous/anxious.       Objective:    Physical Exam Vitals and nursing note reviewed.  Constitutional:      Appearance: Normal appearance.  HENT:     Head: Normocephalic and atraumatic.     Right Ear: External ear normal.     Left Ear: External ear normal.     Nose: Nose normal.     Mouth/Throat:     Mouth: Mucous membranes are moist.     Pharynx: Oropharynx is clear.  Eyes:     Extraocular Movements: Extraocular movements intact.     Conjunctiva/sclera: Conjunctivae normal.     Pupils: Pupils are equal, round, and reactive to light.  Cardiovascular:     Rate and Rhythm: Normal rate and regular rhythm.     Pulses: Normal pulses.     Heart sounds: Normal heart sounds.  Pulmonary:     Effort: Pulmonary effort is normal.     Breath sounds: Normal breath sounds.  Musculoskeletal:     Cervical back: Normal range of motion and neck supple.     Right knee: Bony tenderness and crepitus present. No swelling. Normal range of motion.     Left knee: Bony tenderness and crepitus present. No swelling. Normal range of motion.     Right ankle: Normal.     Left ankle: No swelling. Tenderness present. Decreased range of motion.     Right foot: Normal.     Left foot: Tenderness present. No swelling. Normal pulse.  Skin:    General: Skin is warm and dry.  Neurological:     General: No focal deficit present.     Mental Status: She is alert and oriented to person, place, and time.  Psychiatric:        Mood and Affect: Mood normal.        Behavior: Behavior normal.        Thought Content: Thought content normal.        Judgment: Judgment normal.     BP 124/76 (BP Location: Left Arm, Patient Position: Sitting, Cuff Size: Normal)   Pulse 62   Temp 98.2 F (36.8 C) (Oral)   Resp 18   Ht 5\' 3"  (1.6 m)   Wt 193 lb (87.5 kg)   SpO2 97%   BMI 34.19 kg/m  Wt Readings from Last 3 Encounters:  06/22/20 193 lb (87.5 kg)  04/25/20 194 lb (88 kg)  01/22/19 190 lb (86.2  kg)     Health Maintenance Due  Topic Date Due  . Hepatitis C Screening  Never done  . COVID-19 Vaccine (1) Never done  . MAMMOGRAM  07/29/2018  . PAP SMEAR-Modifier  02/22/2019  . COLONOSCOPY  10/27/2019    There are no preventive care reminders to display for this patient.  Lab Results  Component  Value Date   TSH 0.413 (L) 08/21/2018   Lab Results  Component Value Date   WBC 5.2 08/21/2018   HGB 14.7 08/21/2018   HCT 44.2 08/21/2018   MCV 89 08/21/2018   PLT 259 08/21/2018   Lab Results  Component Value Date   NA 138 08/21/2018   K 4.3 08/21/2018   CO2 21 08/21/2018   GLUCOSE 96 08/21/2018   BUN 9 08/21/2018   CREATININE 0.67 08/21/2018   BILITOT 0.3 08/21/2018   ALKPHOS 132 (H) 08/21/2018   AST 26 08/21/2018   ALT 15 08/21/2018   PROT 8.0 08/21/2018   ALBUMIN 4.6 08/21/2018   CALCIUM 10.0 08/21/2018   Lab Results  Component Value Date   CHOL 150 08/21/2018   Lab Results  Component Value Date   HDL 51 08/21/2018   Lab Results  Component Value Date   LDLCALC 82 08/21/2018   Lab Results  Component Value Date   TRIG 85 08/21/2018   Lab Results  Component Value Date   CHOLHDL 2.9 08/21/2018   No results found for: HGBA1C    Assessment & Plan:   Problem List Items Addressed This Visit      Endocrine   Subclinical hyperthyroidism   Relevant Orders   CBC with Differential/Platelet   Comp. Metabolic Panel (12)   TSH     Other   Chronic pain of both knees - Primary   Relevant Orders   DG Knee Complete 4 Views Right   DG Knee Complete 4 Views Left   Left foot pain   Relevant Orders   DG Foot Complete Left   DG Ankle Complete Left   Depression   GAD (generalized anxiety disorder)   Vitamin D deficiency   Relevant Orders   Vitamin D, 25-hydroxy    Other Visit Diagnoses    Encounter for HCV screening test for low risk patient       Relevant Orders   HCV Ab w/Rflx to Verification   Screening for HIV without presence of risk factors        Relevant Orders   HIV antibody (with reflex)   Screening, lipid       Relevant Orders   Lipid panel   Need for immunization against influenza       Relevant Orders   Flu Vaccine QUAD 36+ mos IM (Completed)    1. Chronic pain of both knees Patient education given on RICE, continue Mobic as needed, no refill needed today.  Strongly encourage patient to keep appointment to establish care with Dr. Juleen China, and continue work on obtaining [place].   - DG Knee Complete 4 Views Right; Future - DG Knee Complete 4 Views Left; Future  2. Left foot pain  - DG Foot Complete Left; Future - DG Ankle Complete Left; Future  3. Depression, unspecified depression type Patient refused referral to CBT  4. GAD (generalized anxiety disorder)   5. Vitamin D deficiency  - Vitamin D, 25-hydroxy  6. Subclinical hyperthyroidism  - CBC with Differential/Platelet - Comp. Metabolic Panel (12) - TSH  7. Encounter for HCV screening test for low risk patient  - HCV Ab w/Rflx to Verification  8. Screening for HIV without presence of risk factors  - HIV antibody (with reflex)  9. Screening, lipid  - Lipid panel   No orders of the defined types were placed in this encounter.   I have reviewed the patient's medical history (PMH, PSH, Social History, Family History, Medications, and allergies) ,  and have been updated if relevant. I spent 30 minutes reviewing chart and  face to face time with patient.    Follow-up: Return in about 19 days (around 07/11/2020) for To establish PCP, with Dr. Juleen China at [facility].    Loraine Grip Mayers, PA-C

## 2020-06-22 NOTE — Addendum Note (Signed)
Addended by: Kennieth Rad on: 06/22/2020 04:21 PM   Modules accepted: Orders

## 2020-06-22 NOTE — Patient Instructions (Signed)
Continue using the Mobic as needed for your knee pain, I ordered x-rays today needed on your behalf  We will call you with your lab results, you will also be able to see them in MyChart  Please make sure to keep your appointment with Dr. Juleen China to establish care on July 11, 2020.  Please let us know if there is anything else we can do for you  Kennieth Rad, PA-C Physician Assistant [place] [email or web address]    Chronic Knee Pain, Adult Chronic knee pain is pain in one or both knees that lasts longer than 3 months. Symptoms of chronic knee pain may include swelling, stiffness, and discomfort. Age-related wear and tear (osteoarthritis) of the knee joint is the most common cause of chronic knee pain. Other possible causes include:  A long-term immune-related disease that causes inflammation of the knee (rheumatoid arthritis). This usually affects both knees.  Inflammatory arthritis, such as gout or pseudogout.  An injury to the knee that causes arthritis.  An injury to the knee that damages the ligaments. Ligaments are strong tissues that connect bones to each other.  Runner's knee or pain behind the kneecap. Treatment for chronic knee pain depends on the cause. The main treatments for chronic knee pain are physical therapy and weight loss. This condition may also be treated with medicines, injections, a knee sleeve or brace, and by using crutches. Rest, ice, compression (pressure), and elevation (RICE) therapy may also be recommended. Follow these instructions at home: If you have a knee sleeve or brace:   Wear it as told by your health care provider. Remove it only as told by your health care provider.  Loosen it if your toes tingle, become numb, or turn cold and blue.  Keep it clean.  If the sleeve or brace is not waterproof: ? Do not let it get wet. ? Remove it if allowed by your health care provider, or cover  it with a watertight covering when you take a bath or a shower. Managing pain, stiffness, and swelling      If directed, apply heat to the affected area as often as told by your health care provider. Use the heat source that your health care provider recommends, such as a moist heat pack or a heating pad. ? If you have a removable sleeve or brace, remove it as told by your health care provider. ? Place a towel between your skin and the heat source. ? Leave the heat on for 20-30 minutes. ? Remove the heat if your skin turns bright red. This is especially important if you are unable to feel pain, heat, or cold. You may have a greater risk of getting burned.  If directed, put ice on the affected area. ? If you have a removable sleeve or brace, remove it as told by your health care provider. ? Put ice in a plastic bag. ? Place a towel between your skin and the bag. ? Leave the ice on for 20 minutes, 2-3 times a day.  Move your toes often to reduce stiffness and swelling.  Raise (elevate) the injured area above the level of your heart while you are sitting or lying down. Activity  Avoid activities where both feet leave the ground at the same time (high-impact activities). Examples are running, jumping rope, and doing jumping jacks.  Return to your normal activities as told by your health care provider. Ask your health care provider what activities are safe for you.  Follow  the exercise plan that your health care provider designed for you. Your health care provider may suggest that you: ? Avoid activities that make knee pain worse. This may require you to change your exercise routines, sport participation, or job duties. ? Wear shoes with cushioned soles. ? Avoid high-impact activities or sports that require running and sudden changes in direction. ? Do physical therapy as told by your health care provider. Physical therapy is planned to match your needs and abilities. It may include  exercises for strength, flexibility, stability, and endurance. ? Do exercises that increase balance and strength, such as tai chi and yoga.  Do not use the injured limb to support your body weight until your health care provider says that you can. Use crutches, a cane, or a walker, as told by your health care provider. General instructions  Take over-the-counter and prescription medicines only as told by your health care provider.  Lose weight if you are overweight. Losing even a little weight can reduce knee pain. Ask your health care provider what your ideal weight is, and how to safely lose extra weight. A food expert (dietitian) may be able to help you plan your meals.  Do not use any products that contain nicotine or tobacco, such as cigarettes, e-cigarettes, and chewing tobacco. These can delay healing. If you need help quitting, ask your health care provider.  Keep all follow-up visits as told by your health care provider. This is important. Contact a health care provider if:  You have knee pain that is not getting better or gets worse.  You are unable to do your physical therapy exercises due to knee pain. Get help right away if:  Your knee swells and the swelling becomes worse.  You cannot move your knee.  You have severe knee pain. Summary  Knee pain that lasts more than 3 months is considered chronic knee pain.  The main treatments for chronic knee pain are physical therapy and weight loss. You may also need to take medicines, wear a knee sleeve or brace, use crutches, and apply ice or heat.  Losing even a little weight can reduce knee pain. Ask your health care provider what your ideal weight is, and how to safely lose extra weight. A food expert (dietitian) may be able to help you plan your meals.  Work with a physical therapist to make a safe exercise program, as told by your health care provider. This information is not intended to replace advice given to you by your  health care provider. Make sure you discuss any questions you have with your health care provider. Document Revised: 08/27/2018 Document Reviewed: 08/27/2018 Elsevier Patient Education  [place].

## 2020-06-23 LAB — COMP. METABOLIC PANEL (12)
AST: 22 IU/L (ref 0–40)
Albumin/Globulin Ratio: 1.3 (ref 1.2–2.2)
Albumin: 4.3 g/dL (ref 3.8–4.9)
Alkaline Phosphatase: 108 IU/L (ref 44–121)
BUN/Creatinine Ratio: 15 (ref 9–23)
BUN: 11 mg/dL (ref 6–24)
Bilirubin Total: 0.4 mg/dL (ref 0.0–1.2)
Calcium: 9.5 mg/dL (ref 8.7–10.2)
Chloride: 104 mmol/L (ref 96–106)
Creatinine, Ser: 0.72 mg/dL (ref 0.57–1.00)
GFR calc Af Amer: 111 mL/min/{1.73_m2} (ref >59)
GFR calc non Af Amer: 96 mL/min/{1.73_m2} (ref >59)
Globulin, Total: 3.2 g/dL (ref 1.5–4.5)
Glucose: 93 mg/dL (ref 65–99)
Potassium: 4.2 mmol/L (ref 3.5–5.2)
Sodium: 140 mmol/L (ref 134–144)
Total Protein: 7.5 g/dL (ref 6.0–8.5)

## 2020-06-23 LAB — CBC WITH DIFFERENTIAL/PLATELET
Basophils Absolute: 0 10*3/uL (ref 0.0–0.2)
Basos: 1 %
EOS (ABSOLUTE): 0 10*3/uL (ref 0.0–0.4)
Eos: 1 %
Hematocrit: 42.2 % (ref 34.0–46.6)
Hemoglobin: 14.4 g/dL (ref 11.1–15.9)
Immature Grans (Abs): 0 10*3/uL (ref 0.0–0.1)
Immature Granulocytes: 0 %
Lymphocytes Absolute: 2 10*3/uL (ref 0.7–3.1)
Lymphs: 43 %
MCH: 30.5 pg (ref 26.6–33.0)
MCHC: 34.1 g/dL (ref 31.5–35.7)
MCV: 89 fL (ref 79–97)
Monocytes Absolute: 0.4 10*3/uL (ref 0.1–0.9)
Monocytes: 9 %
Neutrophils Absolute: 2.1 10*3/uL (ref 1.4–7.0)
Neutrophils: 46 %
Platelets: 266 10*3/uL (ref 150–450)
RBC: 4.72 x10E6/uL (ref 3.77–5.28)
RDW: 12.4 % (ref 11.7–15.4)
WBC: 4.5 10*3/uL (ref 3.4–10.8)

## 2020-06-23 LAB — LIPID PANEL
Chol/HDL Ratio: 3.2 ratio (ref 0.0–4.4)
Cholesterol, Total: 143 mg/dL (ref 100–199)
HDL: 45 mg/dL (ref >39)
LDL Chol Calc (NIH): 83 mg/dL (ref 0–99)
Triglycerides: 79 mg/dL (ref 0–149)
VLDL Cholesterol Cal: 15 mg/dL (ref 5–40)

## 2020-06-23 LAB — HCV AB W/RFLX TO VERIFICATION: HCV Ab: 0.1 s/co ratio (ref 0.0–0.9)

## 2020-06-23 LAB — HIV ANTIBODY (ROUTINE TESTING W REFLEX): HIV Screen 4th Generation wRfx: NONREACTIVE

## 2020-06-23 LAB — TSH: TSH: 0.496 u[IU]/mL (ref 0.450–4.500)

## 2020-06-23 LAB — HCV INTERPRETATION

## 2020-06-23 LAB — VITAMIN D 25 HYDROXY (VIT D DEFICIENCY, FRACTURES): Vit D, 25-Hydroxy: 27.5 ng/mL — ABNORMAL LOW (ref 30.0–100.0)

## 2020-06-27 ENCOUNTER — Other Ambulatory Visit: Payer: Self-pay | Admitting: Physician Assistant

## 2020-06-27 DIAGNOSIS — Z1231 Encounter for screening mammogram for malignant neoplasm of breast: Secondary | ICD-10-CM

## 2020-06-27 NOTE — Telephone Encounter (Signed)
-----   Message from Roney Jaffe, [place] sent at 06/27/2020  8:38 AM EST ----- Please call patient and let her know that her kidney and liver function as well as her thyroid function were within normal limits.  Her cholesterol is well controlled.  Her screening for hepatitis C and HIV were negative.  Her vitamin D is slightly low, she should take 2000 units over-the-counter on a daily basis.

## 2020-06-28 ENCOUNTER — Encounter: Payer: Self-pay | Admitting: Orthopedic Surgery

## 2020-06-28 ENCOUNTER — Ambulatory Visit (INDEPENDENT_AMBULATORY_CARE_PROVIDER_SITE_OTHER): Payer: Medicaid Other | Admitting: Orthopedic Surgery

## 2020-06-28 VITALS — Ht 63.0 in | Wt 193.0 lb

## 2020-06-28 DIAGNOSIS — M25572 Pain in left ankle and joints of left foot: Secondary | ICD-10-CM

## 2020-06-28 DIAGNOSIS — M25562 Pain in left knee: Secondary | ICD-10-CM

## 2020-06-28 DIAGNOSIS — M25571 Pain in right ankle and joints of right foot: Secondary | ICD-10-CM

## 2020-06-28 DIAGNOSIS — M25532 Pain in left wrist: Secondary | ICD-10-CM

## 2020-06-28 DIAGNOSIS — M25531 Pain in right wrist: Secondary | ICD-10-CM

## 2020-06-28 DIAGNOSIS — G8929 Other chronic pain: Secondary | ICD-10-CM

## 2020-06-28 DIAGNOSIS — M25561 Pain in right knee: Secondary | ICD-10-CM

## 2020-06-28 NOTE — Progress Notes (Signed)
Office Visit Note   Patient: Angel Bennett           Date of Birth: 1965-08-18           MRN: [id number] Visit Date: 06/28/2020              Requested by: Mayers, Loraine Grip, PA-C [id number],  [place] [id number] PCP: Patient, No Pcp Per  Chief Complaint  Patient presents with  . Right Knee - Pain  . Left Knee - Pain  . Left Foot - Pain  . Left Ankle - Pain      HPI: Patient is a 54 year old woman who presents complaining of essentially all of her joints hurting.  She complains of popping in her knees and decreased range of motion of her knees she states she has had steroid injections in the past that did provide good relief.  Patient states she has warmth and swelling and pain in most of her joints she states she has pain in both wrists and fingers pain in both ankles and pain in both shoulders.  Patient denies a history of gout.  Assessment & Plan: Visit Diagnoses: No diagnosis found.  Plan: We will request a rheumatology consult.  Discussed that she has a systemic inflammation of the joints and most likely rheumatologic in origin.  Discussed that this could also be gout.  Follow-Up Instructions: No follow-ups on file.   Ortho Exam  Patient is alert, oriented, no adenopathy, well-dressed, normal affect, normal respiratory effort. Examination patient has good range of motion of the wrists shoulders knees and ankles.  She has global tenderness to palpation around her wrists there is no ulnar deviation at the MCP joint there is mild swelling at the PIP and DIP joints of her hands.  Patient does have crepitation with range of motion of both knees and pain and tenderness and swelling around her ankles.  Review of the radiographs of both knees shows arthritic changes with periarticular bony spurs radiographs of the left ankle shows normal bony anatomy radiographs of the left foot shows normal bony anatomy.  Imaging: No results found. No images are attached to the  encounter.  Labs: Lab Results  Component Value Date   REPTSTATUS 01/05/2011 FINAL 01/01/2011   CULT  01/01/2011    NO SALMONELLA, SHIGELLA, CAMPYLOBACTER, OR YERSINIA ISOLATED   LABORGA No Salmonella,Shigella,Campylobacter,Yersinia,or 12/17/2014   LABORGA No E.coli 0157:H7 isolated. 12/17/2014     Lab Results  Component Value Date   ALBUMIN 4.3 06/22/2020   ALBUMIN 4.6 08/21/2018   ALBUMIN 4.4 05/27/2017    No results found for: MG Lab Results  Component Value Date   VD25OH 27.5 (L) 06/22/2020   VD25OH 28.7 (L) 05/27/2017    No results found for: PREALBUMIN CBC EXTENDED Latest Ref Rng & Units 06/22/2020 08/21/2018 05/27/2017  WBC 3.4 - 10.8 x10E3/uL 4.5 5.2 4.9  RBC 3.77 - 5.28 x10E6/uL 4.72 4.95 4.58  HGB 11.1 - 15.9 g/dL 14.4 14.7 13.8  HCT 34.0 - 46.6 % 42.2 44.2 40.2  PLT 150 - 450 x10E3/uL 266 259 251  NEUTROABS 1.4 - 7.0 x10E3/uL 2.1 2.3 2.0  LYMPHSABS 0.7 - 3.1 x10E3/uL 2.0 2.5 2.5     Body mass index is 34.19 kg/m.  Orders:  No orders of the defined types were placed in this encounter.  No orders of the defined types were placed in this encounter.    Procedures: No procedures performed  Clinical Data: No additional findings.  ROS:  All other systems negative, except as noted in the HPI. Review of Systems  Objective: Vital Signs: Ht 5\' 3"  (1.6 m)   Wt 193 lb (87.5 kg)   LMP 02/19/2017 (Approximate)   BMI 34.19 kg/m   Specialty Comments:  No specialty comments available.  PMFS History: Patient Active Problem List   Diagnosis Date Noted  . Chronic pain of both knees 06/22/2020  . Left foot pain 06/22/2020  . Depression 06/22/2020  . GAD (generalized anxiety disorder) 06/22/2020  . Vitamin D deficiency 06/22/2020  . Epigastric pain 01/13/2019  . Bloating 01/13/2019  . Frequency of urination 12/29/2018  . Moderate tricuspid regurgitation 05/27/2017  . Seasonal allergies 04/11/2017  . Subclinical hyperthyroidism 07/11/2016  .  Thyrotoxicosis 04/09/2016  . IUD (intrauterine device) in place 03/29/2016  . Perimenopause 03/20/2016  . Menorrhagia with regular cycle 02/22/2016  . Oligomenorrhea 02/22/2016  . Cluster headache 02/22/2016  . Palpitations 05/20/2015  . Chest pain 05/20/2015  . Hx of adenomatous polyp of colon 10/27/2014  . Chronic abdominal pain 04/28/2013  . History of Helicobacter pylori infection 04/28/2013  . Chronic cholecystitis 03/06/2011  . GERD (gastroesophageal reflux disease) 03/06/2011  . Constipation 03/06/2011   Past Medical History:  Diagnosis Date  . Abdominal pain    Diffuse mild  . C. difficile colitis 05/06/2011   Treated w PO Metronidazole  . Chest pain 05/20/2015  . Constipation, chronic   . GERD (gastroesophageal reflux disease)   . H. pylori infection   . Hx of adenomatous polyp of colon 10/27/2014  . Hyperthyroidism 05/20/2015  . Palpitations 05/20/2015  . Partial small bowel obstruction (HCC) 04/28/2013  . Ringing in ears    both ears    Family History  Problem Relation Age of Onset  . Diabetes Mother 89  . Hypertension Mother   . Hypertension Father     Past Surgical History:  Procedure Laterality Date  . CESAREAN SECTION  1990  . CHOLECYSTECTOMY  03/13/2011   lap single site  . COLONOSCOPY  05/15/2011   Procedure: COLONOSCOPY;  Surgeon: 05/17/2011, MD;  Location: [facility];  Service: Endoscopy;  Laterality: N/A;  . ESOPHAGOGASTRODUODENOSCOPY  05/15/2011   Procedure: ESOPHAGOGASTRODUODENOSCOPY (EGD);  Surgeon: 05/17/2011, MD;  [facility];  Service: Endoscopy;  Laterality: N/A;  . HEMORRHOID SURGERY  1990   Social History   Occupational History  . Not on file  Tobacco Use  . Smoking status: Never Smoker  . Smokeless tobacco: Never Used  Substance and Sexual Activity  . Alcohol use: No  . Drug use: No  . Sexual activity: Yes    Birth control/protection: None

## 2020-07-03 ENCOUNTER — Ambulatory Visit (INDEPENDENT_AMBULATORY_CARE_PROVIDER_SITE_OTHER): Payer: Medicaid Other

## 2020-07-03 ENCOUNTER — Encounter (HOSPITAL_COMMUNITY): Payer: Self-pay | Admitting: Emergency Medicine

## 2020-07-03 ENCOUNTER — Other Ambulatory Visit: Payer: Self-pay

## 2020-07-03 ENCOUNTER — Ambulatory Visit (HOSPITAL_COMMUNITY)
Admission: EM | Admit: 2020-07-03 | Discharge: 2020-07-03 | Disposition: A | Discharge status: Home / Self Care | Payer: Medicaid Other | Attending: Family Medicine | Admitting: Family Medicine

## 2020-07-03 DIAGNOSIS — R079 Chest pain, unspecified: Secondary | ICD-10-CM

## 2020-07-03 DIAGNOSIS — J069 Acute upper respiratory infection, unspecified: Secondary | ICD-10-CM | POA: Diagnosis present

## 2020-07-03 DIAGNOSIS — U071 COVID-19: Secondary | ICD-10-CM | POA: Insufficient documentation

## 2020-07-03 DIAGNOSIS — Z20822 Contact with and (suspected) exposure to covid-19: Secondary | ICD-10-CM

## 2020-07-03 DIAGNOSIS — R519 Headache, unspecified: Secondary | ICD-10-CM

## 2020-07-03 DIAGNOSIS — R059 Cough, unspecified: Secondary | ICD-10-CM

## 2020-07-03 DIAGNOSIS — R0789 Other chest pain: Secondary | ICD-10-CM | POA: Diagnosis present

## 2020-07-03 DIAGNOSIS — R062 Wheezing: Secondary | ICD-10-CM

## 2020-07-03 MED ORDER — IBUPROFEN 800 MG PO TABS
ORAL_TABLET | ORAL | Status: AC
  Filled 2020-07-03: qty 1

## 2020-07-03 MED ORDER — ACETAMINOPHEN 325 MG PO TABS
ORAL_TABLET | ORAL | Status: AC
  Filled 2020-07-03: qty 2

## 2020-07-03 MED ORDER — IBUPROFEN 800 MG PO TABS
800.0000 mg | ORAL_TABLET | Freq: Once | ORAL | Status: AC
  Administered 2020-07-03: 800 mg via ORAL

## 2020-07-03 MED ORDER — ACETAMINOPHEN 325 MG PO TABS: 650.0000 mg | ORAL_TABLET | Freq: Once | ORAL | Status: DC

## 2020-07-03 NOTE — ED Triage Notes (Signed)
Pt states that she has a HA, cough, chest pain, wheezing, chills, and fever. Pt states that her sx started 2 days ago.

## 2020-07-03 NOTE — ED Provider Notes (Signed)
[facility]    CSN: [id number] Arrival date & time: 07/03/20  1656      History   Chief Complaint No chief complaint on file.   HPI Angel Bennett is a 55 y.o. female.   HPI  Patient has had her Covid vaccination She does not think she has been exposed to Covid She has had a headache, cough, chest pain and wheezing, fever and chills, body aches and fatigue for 2 days.  Temperature is 101.  She states she is taken Tylenol at home.  She does not feel like this is working for the fever.  In addition she states she thinks the Tylenol was making her feel confused. No nausea or vomiting No shortness of breath No dizziness  Past Medical History:  Diagnosis Date  . Abdominal pain    Diffuse mild  . C. difficile colitis [id number]   Treated w PO Metronidazole  . Chest pain 05/20/2015  . Constipation, chronic   . GERD (gastroesophageal reflux disease)   . H. pylori infection   . Hx of adenomatous polyp of colon 10/27/2014  . Hyperthyroidism 05/20/2015  . Palpitations 05/20/2015  . Partial small bowel obstruction (HCC) 04/28/2013  . Ringing in ears    both ears    Patient Active Problem List   Diagnosis Date Noted  . Chronic pain of both knees 06/22/2020  . Left foot pain 06/22/2020  . Depression 06/22/2020  . GAD (generalized anxiety disorder) 06/22/2020  . Vitamin D deficiency 06/22/2020  . Epigastric pain 01/13/2019  . Bloating 01/13/2019  . Frequency of urination 12/29/2018  . Moderate tricuspid regurgitation 05/27/2017  . Seasonal allergies 04/11/2017  . Subclinical hyperthyroidism 07/11/2016  . Thyrotoxicosis 04/09/2016  . IUD (intrauterine device) in place 03/29/2016  . Perimenopause 03/20/2016  . Menorrhagia with regular cycle 02/22/2016  . Oligomenorrhea 02/22/2016  . Cluster headache 02/22/2016  . Palpitations 05/20/2015  . Chest pain 05/20/2015  . Hx of adenomatous polyp of colon 10/27/2014  . Chronic abdominal pain 04/28/2013  . History of  Helicobacter pylori infection 04/28/2013  . Chronic cholecystitis 03/06/2011  . GERD (gastroesophageal reflux disease) 03/06/2011  . Constipation 03/06/2011    Past Surgical History:  Procedure Laterality Date  . CESAREAN SECTION  1990  . CHOLECYSTECTOMY  03/13/2011   lap single site  . COLONOSCOPY  05/15/2011   Procedure: COLONOSCOPY;  Surgeon: [facility], MD;  Location: [facility];  Service: Endoscopy;  Laterality: N/A;  . ESOPHAGOGASTRODUODENOSCOPY  05/15/2011   Procedure: ESOPHAGOGASTRODUODENOSCOPY (EGD);  Surgeon: [facility], MD;  Location[facility];  Service: Endoscopy;  Laterality: N/A;  . HEMORRHOID SURGERY  1990    OB History    Gravida  5   Para  5   Term      Preterm      AB      Living  5     SAB      IAB      Ectopic      Multiple      Live Births               Home Medications    Prior to Admission medications   Medication Sig Start Date End Date Taking? Authorizing Provider  fluticasone (FLONASE) 50 MCG/ACT nasal spray Place 1 spray into both nostrils 2 (two) times daily. 08/21/18   Lezlie Lye, Meda Coffee, MD  meloxicam (MOBIC) 15 MG tablet Take 1 tablet (15 mg total) by mouth daily. 04/25/20  Mayers, Cari S, PA-C  omeprazole (PRILOSEC) 40 MG capsule Take one po BID 12/29/18   Corum, Rex Kras, MD    Family History Family History  Problem Relation Age of Onset  . Diabetes Mother 76  . Hypertension Mother   . Hypertension Father     Social History Social History   Tobacco Use  . Smoking status: Never Smoker  . Smokeless tobacco: Never Used  Substance Use Topics  . Alcohol use: No  . Drug use: No     Allergies   Patient has no known allergies.   Review of Systems Review of Systems See HPI  Physical Exam Triage Vital Signs ED Triage Vitals  Enc Vitals Group     BP 07/03/20 1839 (!) 148/91     Pulse Rate 07/03/20 1839 (!) 101     Resp 07/03/20 1839 19     Temp 07/03/20 1839 (!) 101.1 F (38.4 C)      Temp Source 07/03/20 1839 Oral     SpO2 07/03/20 1839 97 %     Weight --      Height --      Head Circumference --      Peak Flow --      Pain Score 07/03/20 1838 10     Pain Loc --      Pain Edu? --      Excl. in [place]? --    No data found.  Updated Vital Signs BP (!) 148/91 (BP Location: Left Arm)   Pulse (!) 101   Temp (!) 101.1 F (38.4 C) (Oral)   Resp 19   LMP 02/19/2017 (Approximate)   SpO2 97%   Physical Exam Constitutional:      General: She is not in acute distress.    Appearance: She is well-developed and well-nourished.     Comments: Appears moderately ill  HENT:     Head: Normocephalic and atraumatic.     Nose: No congestion.     Mouth/Throat:     Mouth: Oropharynx is clear and moist. Mucous membranes are moist.     Pharynx: No posterior oropharyngeal erythema.  Eyes:     Conjunctiva/sclera: Conjunctivae normal.     Pupils: Pupils are equal, round, and reactive to light.  Cardiovascular:     Rate and Rhythm: Normal rate and regular rhythm.     Heart sounds: Normal heart sounds.  Pulmonary:     Effort: Pulmonary effort is normal. No respiratory distress.     Comments: Lungs are clear Abdominal:     General: There is no distension.     Palpations: Abdomen is soft.  Musculoskeletal:        General: No edema. Normal range of motion.     Cervical back: Normal range of motion.  Skin:    General: Skin is warm and dry.  Neurological:     General: No focal deficit present.     Mental Status: She is alert.  Psychiatric:        Behavior: Behavior normal.      UC Treatments / Results  Labs (all labs ordered are listed, but only abnormal results are displayed)    Radiology DG Chest 2 View  Result Date: 07/03/2020 CLINICAL DATA:  Headache, cough, chest pain and wheezing. EXAM: CHEST - 2 VIEW COMPARISON:  January 12, 2018 FINDINGS: The heart size and mediastinal contours are within normal limits. Both lungs are clear. The visualized skeletal structures are  unremarkable. IMPRESSION: No active cardiopulmonary disease. Electronically  Signed   By: Virgina Norfolk M.D.   On: 07/03/2020 19:53    Procedures ED EKG  Date/Time: 07/03/2020 7:30 PM Performed by: Raylene Everts, MD Authorized by: Raylene Everts, MD   ECG reviewed by ED Physician in the absence of a cardiologist: yes   Previous ECG:    Previous ECG:  Unavailable Interpretation:    Interpretation: normal   Rate:    ECG rate assessment: normal   Rhythm:    Rhythm: sinus rhythm   Ectopy:    Ectopy: none   QRS:    QRS axis:  Normal ST segments:    ST segments:  Normal T waves:    T waves: normal   Q waves:    Abnormal Q-waves: not present     (including critical care time)  Medications Ordered in UC Medications  ibuprofen (ADVIL) tablet 800 mg (800 mg Oral Given 07/03/20 1851)    Initial Impression / Assessment and Plan / UC Course  I have reviewed the triage vital signs and the nursing notes.  Pertinent labs & imaging results that were available during my care of the patient were reviewed by me and considered in my medical decision making (see chart for details).     EKG done for chest pain.  It is negative. Patient requested a chest x-ray.  I explained her that her lung exam is normal.  She insists that she hears noises in her lungs and that she has pneumonia.  Chest x-ray is done to reassurance and is negative Final Clinical Impressions(s) / UC Diagnoses   Final diagnoses:  Other chest pain  Viral URI with cough  Encounter for laboratory testing for COVID-19 virus     Discharge Instructions     Chest x-ray is normal.  No pneumonia Go home to rest Drink plenty of fluids Take Tylenol or ibuprofen for pain or fever You may take over-the-counter cough and cold medicines as needed You must quarantine at home until your test result is available You can check for your test result in MyChart      ED Prescriptions    None     PDMP not reviewed  this encounter.   Raylene Everts, MD 07/03/20 2008

## 2020-07-03 NOTE — Discharge Instructions (Signed)
Chest x-ray is normal.  No pneumonia Go home to rest Drink plenty of fluids Take Tylenol or ibuprofen for pain or fever You may take over-the-counter cough and cold medicines as needed You must quarantine at home until your test result is available You can check for your test result in MyChart

## 2020-07-04 ENCOUNTER — Telehealth: Payer: Self-pay | Admitting: General Practice

## 2020-07-04 LAB — SARS CORONAVIRUS 2 (TAT 6-24 HRS): SARS Coronavirus 2: POSITIVE — AB

## 2020-07-04 NOTE — Telephone Encounter (Signed)
Called patient and LVM letting her know her appt for 1/11 had been cancelled due to provider being out of the office. Advised patient to call back [phone] to reschedule.

## 2020-07-05 ENCOUNTER — Telehealth: Payer: Self-pay | Admitting: Orthopedic Surgery

## 2020-07-05 NOTE — Telephone Encounter (Signed)
Order was just placed to [facility] rheumatology, it is in review at this time, pt aware they will be calling her to schedule

## 2020-07-05 NOTE — Telephone Encounter (Signed)
Pt called because Dr.Duda was suppose to refer her to someone and she hasn't gotten a phone call from anyone and was wondering what was going on. Please give her a call [phone]

## 2020-07-05 NOTE — Telephone Encounter (Signed)
Referral made for Rheumatology. Pt is calling to check the status can you please advise. Thank you so much!

## 2020-07-10 ENCOUNTER — Encounter: Payer: Self-pay | Admitting: Physician Assistant

## 2020-07-10 ENCOUNTER — Other Ambulatory Visit: Payer: Self-pay | Admitting: Physician Assistant

## 2020-07-10 DIAGNOSIS — U071 COVID-19: Secondary | ICD-10-CM

## 2020-07-10 MED ORDER — BENZONATATE 100 MG PO CAPS
100.0000 mg | ORAL_CAPSULE | Freq: Two times a day (BID) | ORAL | 0 refills | Status: DC | PRN
Start: 2020-07-10 — End: 2020-08-31

## 2020-07-10 MED ORDER — CETIRIZINE HCL 10 MG PO TABS: 10.0000 mg | ORAL_TABLET | Freq: Every day | ORAL | 11 refills | Status: DC

## 2020-07-10 MED FILL — BENZONATATE 100 MG CAPS: 100 | 10 days supply | Qty: 20 | Fill #0

## 2020-07-11 ENCOUNTER — Ambulatory Visit: Payer: Medicaid Other | Admitting: Internal Medicine

## 2020-07-13 ENCOUNTER — Ambulatory Visit: Payer: Self-pay | Admitting: Cardiology

## 2020-07-13 NOTE — Progress Notes (Incomplete)
Cardiology Office Note:    Date:  07/13/2020   ID:  Angel Bennett, DOB 08/29/1965, MRN [id number]  PCP:  Patient, No Pcp Per  Cardiologist:  Angel Dresser, MD  Referring MD: No ref. provider found   No chief complaint on file.   History of Present Illness:    Angel Bennett is a 55 y.o. female with a history of moderate TR, GERD who is seen for follow up today. I initially met her via telehealth 11/2018 as a new consult (not seen in [facility] since 2016) at the request of Angel Bennett for the evaluation and management of moderate TR.  The patient does not have symptoms concerning for COVID-19 infection (fever, chills, cough, or new shortness of breath).   Patient's concerns today: she has many symptoms and would like to know if they are due to her heart. She has mild LE swelling which is new over the last year. She has gained weight and feels more fatigued that she has in the past. She is able to do her daily activities. She has intermittent acid sensation and burning, usually related to food, and she still can feel it sometimes the following day. Her ears are clogged, and she sometimes feels her pulse in her neck. She also endorses occasional chest pain.  Chest pain: -Initial onset: many years ago -Quality/duration: sharp, mostly at night, wakes her up in the middle of the night, stable  -Associated symptoms: tender to touch -Aggravating/alleviating factors: worse with certain positions/movements, hurts when she presses on her chest.  -Prior cardiac history: moderate TR diagnosed previously, seen by Dr. Oval Bennett in 2016 for palpitations and chest pain -Prior ECG: NSR, IVCD on ECG 9.27.18 -Prior workup: echo, monitor -Prior treatment: none -Comorbidities: GERD, notes of subclinical hypothyroidism. Denies history of serious infections as a child. -Cardiac ROS: no syncope. Endorses intermittent DOE, some shortness of breath at night though not clear PND/orthopnea, mild LE edema  bilaterally -Family history: no one with heart issues.  Past Medical History:  Diagnosis Date  . Abdominal pain    Diffuse mild  . C. difficile colitis [id number]   Treated w PO Metronidazole  . Chest pain 05/20/2015  . Constipation, chronic   . GERD (gastroesophageal reflux disease)   . H. pylori infection   . Hx of adenomatous polyp of colon 10/27/2014  . Hyperthyroidism 05/20/2015  . Palpitations 05/20/2015  . Partial small bowel obstruction ([place]) 04/28/2013  . Ringing in ears    both ears   Past Surgical History:  Procedure Laterality Date  . CESAREAN SECTION  1990  . CHOLECYSTECTOMY  03/13/2011   lap single site  . COLONOSCOPY  05/15/2011   Procedure: COLONOSCOPY;  Surgeon: Angel Dyke, MD;  Location: [facility];  Service: Endoscopy;  Laterality: N/A;  . ESOPHAGOGASTRODUODENOSCOPY  05/15/2011   Procedure: ESOPHAGOGASTRODUODENOSCOPY (EGD);  Surgeon: Angel Dyke, MD;  Location: [facility];  Service: Endoscopy;  Laterality: N/A;  . [place]     No outpatient medications have been marked as taking for the 07/13/20 encounter (Appointment) with Angel Dresser, MD.     Allergies:   Patient has no known allergies.   Social History   Tobacco Use  . Smoking status: Never Smoker  . Smokeless tobacco: Never Used  Substance Use Topics  . Alcohol use: No  . Drug use: No     Family Hx: The patient's family history includes Diabetes (age of onset: 52) in her mother; Hypertension in her father and  mother.  ROS:   Please see the history of present illness.    Constitutional: Negative for chills, fever, night sweats, unintentional weight loss  HENT: Negative for hearing loss.  Positive for ear pain/fullness Eyes: Negative for loss of vision and eye pain.  Respiratory: Negative for cough, sputum, wheezing.   Cardiovascular: See HPI. Gastrointestinal: Negative for abdominal pain, melena, and hematochezia.  Genitourinary: Negative for dysuria  and hematuria.  Musculoskeletal: Negative for falls and myalgias.  Skin: Negative for itching and rash.  Neurological: Negative for focal weakness, focal sensory changes and loss of consciousness.  Endo/Heme/Allergies: Does not bruise/bleed easily.  All other systems reviewed and are negative.  Prior CV studies:   The following studies were reviewed today: Echo 11/27/18  1. The left ventricle has normal systolic function, with an ejection fraction of 55-60%. The cavity size was normal. Left ventricular diastolic parameters were normal. No evidence of left ventricular regional wall motion abnormalities.  2. The right ventricle has normal systolic function. The cavity was normal. There is no increase in right ventricular wall thickness.  3. The tricuspid valve is not well visualized. Tricuspid valve regurgitation is moderate.  4. The aortic valve is tricuspid.  5. When compared to the prior study: 05/31/2015 - degree of TR has not significantly changed. Side by side comparison of images performed.  Of note, normal RA size, RAP 8 mmHg, IVC normal size and collapses, normal RV TAPSE, TR peak gradient of 24 mmHg.  Also personally reviewed echo from 2016, monitoring from 2016 (no significant abnormalities on monitor)  Labs/Other Tests and Data Reviewed:    EKG:  An ECG dated 03/27/17 was personally reviewed today and demonstrated:  NSR, IVCD  Recent Labs: 06/22/2020: BUN 11; Creatinine, Ser 0.72; Hemoglobin 14.4; Platelets 266; Potassium 4.2; Sodium 140; TSH 0.496   Recent Lipid Panel Lab Results  Component Value Date/Time   CHOL 143 06/22/2020 09:16 AM   TRIG 79 06/22/2020 09:16 AM   HDL 45 06/22/2020 09:16 AM   CHOLHDL 3.2 06/22/2020 09:16 AM   LDLCALC 83 06/22/2020 09:16 AM    Wt Readings from Last 3 Encounters:  06/28/20 193 lb (87.5 kg)  06/22/20 193 lb (87.5 kg)  04/25/20 194 lb (88 kg)     Objective:    Vital Signs:  LMP 02/19/2017 (Approximate)    GEN:  no acute  distress EYES:  sclerae anicteric, EOMI - Extraocular Movements Intact RESPIRATORY:  normal respiratory effort, symmetric expansion CARDIOVASCULAR:  no visible JVD SKIN:  no rash, lesions or ulcers. MUSCULOSKELETAL:  no obvious deformities. NEURO:  alert and oriented x 3, no obvious focal deficit PSYCH:  normal affect  ASSESSMENT & PLAN:    Moderate TR: reviewed anatomy of the valve. As a right sided valve, we do not fix with surgery until it is severe and symptomatic, and even then there is some question as to how much benefit people receive from isolated repair/replacement of the tricuspid valve. We reviewed this today. We also reviewed her echo test results at length, with the following important points: -TR has not progressed -left sided systolic and diastolic function is normal -rest of the valves are normal -there is no suggestion that TR is causing systemic effects based on the echo--right atrial pressure is normal, right ventricular pressure is normal, IVC is normal and collapses with respiration, right ventricular function is normal. This does not suggest a secondary cause at this time, such as pulmonary hypertension -she asked if there was a medication she  could take to fix her valve. There is not one at this time. We also discussed that empirically treating with a medication, such as a fluid pill, when her pressures/volumes are normal can be harmful to the kidneys -I gave recommendations on her other symptoms, below -we discussed that TR does not typically result in chest pain or shortness of breath, usually just fluid/swelling, as it is a solely right sided lesion  Mild bilateral LE edema: based on echo findings, unlikely to be due to TR -recommend compression stockings, elevation -avoid salt  Chest pain: two types, sharp/tender to palpation and acid related to food -both are atypical symptoms. Pain that is tender to palpation is usually musculoskeletal/costochondral. And she  does report a history of reflux--acid sensation and pain with eating is more common for this. Low suspicion that these are cardiac in nature -no further testing now. If quality of pain changes, we can re-evaluate in the future.  COVID-19 Education: The signs and symptoms of COVID-19 were discussed with the patient and how to seek care for testing (follow up with PCP or arrange E-visit).  The importance of social distancing was discussed today.  Time:   Today, I have spent 42 minutes with the patient with telehealth technology discussing the above problems. Total time including review of test results and prior workup, including personal review of the imaging, was 63 minutes.  There are no Patient Instructions on file for this visit.  Medication Adjustments/Labs and Tests Ordered: Current medicines are reviewed at length with the patient today.  Concerns regarding medicines are outlined above.   Tests Ordered: No orders of the defined types were placed in this encounter.   Medication Changes: No orders of the defined types were placed in this encounter.   Disposition:  Follow up 1 year or sooner PRN  Signed, Angel Dresser, MD  07/13/2020 8:50 AM    [facility]

## 2020-07-14 ENCOUNTER — Ambulatory Visit (INDEPENDENT_AMBULATORY_CARE_PROVIDER_SITE_OTHER): Payer: Self-pay | Admitting: Family

## 2020-07-14 ENCOUNTER — Other Ambulatory Visit: Payer: Self-pay | Admitting: Family

## 2020-07-14 ENCOUNTER — Encounter: Payer: Self-pay | Admitting: Family

## 2020-07-14 ENCOUNTER — Other Ambulatory Visit: Payer: Self-pay

## 2020-07-14 VITALS — BP 123/83 | HR 82 | Ht 63.98 in | Wt 194.4 lb

## 2020-07-14 DIAGNOSIS — F411 Generalized anxiety disorder: Secondary | ICD-10-CM

## 2020-07-14 DIAGNOSIS — Z789 Other specified health status: Secondary | ICD-10-CM

## 2020-07-14 DIAGNOSIS — M25561 Pain in right knee: Secondary | ICD-10-CM

## 2020-07-14 DIAGNOSIS — M25562 Pain in left knee: Secondary | ICD-10-CM

## 2020-07-14 DIAGNOSIS — Z7689 Persons encountering health services in other specified circumstances: Secondary | ICD-10-CM

## 2020-07-14 DIAGNOSIS — N814 Uterovaginal prolapse, unspecified: Secondary | ICD-10-CM

## 2020-07-14 DIAGNOSIS — G8929 Other chronic pain: Secondary | ICD-10-CM

## 2020-07-14 DIAGNOSIS — N898 Other specified noninflammatory disorders of vagina: Secondary | ICD-10-CM

## 2020-07-14 DIAGNOSIS — M79672 Pain in left foot: Secondary | ICD-10-CM

## 2020-07-14 DIAGNOSIS — F32A Depression, unspecified: Secondary | ICD-10-CM

## 2020-07-14 MED ORDER — MELOXICAM 15 MG PO TABS: 15.0000 mg | ORAL_TABLET | Freq: Every day | ORAL | 0 refills | Status: DC

## 2020-07-14 MED FILL — MELOXICAM 15 MG TABLET: 15 | 30 days supply | Qty: 30 | Fill #0

## 2020-07-14 NOTE — Progress Notes (Signed)
Establish care Pain during intercourse Needs gyn referral

## 2020-07-14 NOTE — Patient Instructions (Addendum)
Return in 4 to 6 weeks or sooner if needed for annual physical examination, labs, and health maintenance. Arrive fasting meaning having had no food and/or nothing to drink for at least 8 hours prior to appointment.  Meloxicam for knee pain.   Referral to Orthopedics for chronic knee pain. Thank you for choosing Primary Care at [facility] for your medical home!    Angel Bennett was seen by Camillia Herter, NP today.   Cleone Slim Emanuelson's primary care provider is Amy Zachery Dauer, NP.   For the best care possible,  you should try to see Durene Fruits, NP whenever you come to clinic.   We look forward to seeing you again soon!  If you have any questions about your visit today,  please call us at [phone]  Or feel free to reach your provider via [facility].     Chronic Knee Pain, Adult Knee pain that lasts longer than 3 months is called chronic knee pain. You may have pain in one or both knees. Symptoms of chronic knee pain may also include swelling and stiffness. The most common cause is age-related wear and tear (osteoarthritis) of your knee joint. Many conditions can cause chronic knee pain. Treatment depends on the cause. The main treatments are physical therapy and weight loss. It may also be treated with medicines, injections, a knee sleeve or brace, and by using crutches. Rest, ice, pressure (compression), and elevation, also known as RICE therapy, may also be recommended. Follow these instructions at home: If you have a knee sleeve or brace:  Wear the knee sleeve or brace as told by your doctor. Take it off only as told by your doctor.  Loosen it if your toes: ? Tingle. ? Become numb. ? Turn cold and blue.  Keep it clean.  If the sleeve or brace is not waterproof: ? Do not let it get wet. ? Ask your doctor if you may take it off when you take a bath or shower. If not, cover it with a watertight covering.   Managing pain, stiffness, and swelling  If told, put heat on your  knee. Do this as often as told by your doctor. Use the heat source that your doctor recommends, such as a moist heat pack or a heating pad. ? If you have a removable knee sleeve or brace, take it off as told by your doctor. ? Place a towel between your skin and the heat source. ? Leave the heat on for 20-30 minutes. ? Take off the heat if your skin turns bright red. This is very important. If you cannot feel pain, heat, or cold, you have a greater risk of getting burned.  If told, put ice on your knee. To do this: ? If you have a removable knee sleeve or brace, take it off as told by your doctor. ? Put ice in a plastic bag. ? Place a towel between your skin and the bag. ? Leave the ice on for 20 minutes, 2-3 times a day. ? Take off the ice if your skin turns bright red. This is very important. If you cannot feel pain, heat, or cold, you have a greater risk of damage to the area.  Move your toes often.  Raise the injured area above the level of your heart while you are sitting or lying down.      Activity  Avoid activities where both feet leave the ground at the same time (high-impact activities). Examples are  running, jumping rope, and doing jumping jacks.  Follow the exercise plan that your doctor makes for you. Your doctor may suggest that you: ? Avoid activities that make knee pain worse. You may need to change the exercises that you do, the sports that you participate in, or your job duties. ? Wear shoes with cushioned soles. ? Avoid sports that require running and sudden changes in direction. ? Do exercises or physical therapy. This is planned to match your needs and your abilities. ? Do exercises that increase your balance and strength, such as tai chi and yoga.  Do not use your injured knee to support your body weight until your doctor says that you can. Use crutches as told by your doctor.  Return to your normal activities when your doctor says that it is safe. General  instructions  Take over-the-counter and prescription medicines only as told by your doctor.  If you are overweight, work with your doctor and a food expert (dietitian) to set goals to lose weight. Being overweight can make your knee hurt more.  Do not smoke or use any products that contain nicotine or tobacco. If you need help quitting, ask your doctor.  Keep all follow-up visits. Contact a doctor if:  You have knee pain that is not getting better or gets worse.  You are not able to do your exercises due to knee pain. Get help right away if:  Your knee swells and the swelling gets worse.  You cannot move your knee.  You have very bad knee pain. Summary  Knee pain that lasts more than 3 months is called chronic knee pain.  The main treatments for chronic knee pain are physical therapy and weight loss. You may also need to take medicines, wear a knee sleeve or brace, use crutches, and put ice or heat on your knee.  Lose weight if you are overweight. Work with your doctor and a food expert (dietitian) to help you set goals to lose weight. Being overweight can make your knee hurt more.  Follow the exercise plan that your doctor makes for you. This information is not intended to replace advice given to you by your health care provider. Make sure you discuss any questions you have with your health care provider. Document Revised: 12/01/2019 Document Reviewed: 12/01/2019 Elsevier Patient Education  2021 [employer].

## 2020-07-14 NOTE — Progress Notes (Signed)
Subjective:    Angel Bennett - 55 y.o. female MRN [id number]  Date of birth: 1965/12/12  HPI Angel Bennett is to establish care. Patient has a PMH significant for moderate tricuspid regurgitation, chronic cholecystitis, GERD, thyrotoxicosis, subclinical hyperthyroidism, cluster headache, constipation, chronic abdominal pain, chest pain, palpitations, depression, and generalized anxiety disorder.   Current issues and/or concerns: 1. PAIN DURING INTERCOURSE: Occurring for at least 1 year. Endorses vaginal dryness. Denies abnormal vaginal bleeding. No history of hysterectomy. LMP: 1 year ago.   2. PROLAPSED UTERUS: Reports was told in the past that she has a prolapsed uterus. Does urinate some when she coughs. Has 5 children. Requesting to see Gynecology.   3. BILATERAL KNEES AND LEFT ANKLE PAIN: 06/22/2020: Visit with physician assistant Cari Mayers for chronic knee pain. Patient educated on RICE, continued on Mobic as needed. X-rays of both knees. Also, seen for left foot pain at the same visit. X-ray of left foot and ankle completed. Follow-up with PCP.  07/14/2020: Today still having bilateral knee pain and ankle pain. Requesting pain medication and steroid injections of the knees.  4. ANXIETY AND DEPRESSION FOLLOW-UP: 06/22/2020: Visit with physician assistant Cari Mayers. Refused referral to CBT.   07/14/2020: Reports that she does have depression and anxiety. States that this is related to everyday life and being Muslim. States that she did not understand the questions on the depression and anxiety screening. Declines medication therapy at this time, states she does not want to get addicted to anxiety/depression medication. Denies thoughts of self-harm, suicidal ideations, and homicidal ideations.  ROS per HPI   Health Maintenance:  Health Maintenance Due  Topic Date Due  . COVID-19 Vaccine (1) Never done  . MAMMOGRAM  07/29/2018  . PAP SMEAR-Modifier  02/22/2019  . COLONOSCOPY  (Pts 45-27yrs Insurance coverage will need to be confirmed)  10/27/2019   Past Medical History: Patient Active Problem List   Diagnosis Date Noted  . COVID-19 07/10/2020  . Chronic pain of both knees 06/22/2020  . Left foot pain 06/22/2020  . Depression 06/22/2020  . GAD (generalized anxiety disorder) 06/22/2020  . Vitamin D deficiency 06/22/2020  . Epigastric pain 01/13/2019  . Bloating 01/13/2019  . Frequency of urination 12/29/2018  . Moderate tricuspid regurgitation 05/27/2017  . Seasonal allergies 04/11/2017  . Subclinical hyperthyroidism 07/11/2016  . Thyrotoxicosis 04/09/2016  . IUD (intrauterine device) in place 03/29/2016  . Perimenopause 03/20/2016  . Menorrhagia with regular cycle 02/22/2016  . Oligomenorrhea 02/22/2016  . Cluster headache 02/22/2016  . Palpitations 05/20/2015  . Chest pain 05/20/2015  . Hx of adenomatous polyp of colon 10/27/2014  . Chronic abdominal pain 04/28/2013  . History of Helicobacter pylori infection 04/28/2013  . Chronic cholecystitis 03/06/2011  . GERD (gastroesophageal reflux disease) 03/06/2011  . Constipation 03/06/2011   Social History   reports that she has never smoked. She has never used smokeless tobacco. She reports that she does not drink alcohol and does not use drugs.   Family History  family history includes Diabetes (age of onset: 36) in her mother; Hypertension in her father and mother.   Medications: reviewed and updated   Objective:   Physical Exam BP 123/83 (BP Location: Left Arm, Patient Position: Sitting)   Pulse 82   Ht 5' 3.98" (1.625 m)   Wt 194 lb 6.4 oz (88.2 kg)   LMP 02/19/2017 (Approximate)   SpO2 96%   BMI 33.39 kg/m  Physical Exam HENT:     Head: Normocephalic.  Eyes:  Extraocular Movements: Extraocular movements intact.     Pupils: Pupils are equal, round, and reactive to light.  Cardiovascular:     Rate and Rhythm: Normal rate and regular rhythm.     Pulses: Normal pulses.     Heart  sounds: Normal heart sounds.  Pulmonary:     Effort: Pulmonary effort is normal.     Breath sounds: Normal breath sounds.  Musculoskeletal:     Cervical back: Normal range of motion and neck supple.  Neurological:     General: No focal deficit present.     Mental Status: She is alert and oriented to person, place, and time.  Psychiatric:        Mood and Affect: Mood normal.        Behavior: Behavior normal.     GAD 7 : Generalized Anxiety Score 07/14/2020  Nervous, Anxious, on Edge 2  Control/stop worrying 3  Worry too much - different things 3  Trouble relaxing 2  Restless 1  Easily annoyed or irritable 3  Afraid - awful might happen 3  Total GAD 7 Score 17    PHQ9 SCORE ONLY 07/14/2020 12/29/2018 11/26/2018  PHQ-9 Total Score 14 0 0  \ Assessment & Plan:  1. Encounter to establish care: - Patient presents today to establish care.  - Return in 4 to 6 weeks or sooner if needed for annual physical examination, labs, and health maintenance. Arrive fasting meaning having had no food and/or nothing to drink for at least 8 hours prior to appointment.  2. Chronic pain of both knees: - Visit 06/22/2020 for chronic knee pain. Patient educated on RICE, continued on Mobic as needed. X-rays of both knees. Follow-up with PCP. - X-ray of left knee on 06/22/2020 resulted with degenerative changes, no acute abnormalities. - X-ray of right knee of 06/22/2020 resulted with degenerative changes, no acute abnormalities. - Today still having bilateral knee pain. Requesting pain medication and steroid injections of the knees. - Continue Meloxicam as prescribed. - Per patient request referral to Orthopedic Surgery for further evaluation and management. - meloxicam (MOBIC) 15 MG tablet; Take 1 tablet (15 mg total) by mouth daily.  Dispense: 30 tablet; Refill: 0 - Ambulatory referral to Orthopedic Surgery  3. Left foot pain: - Visit 06/22/2020 for left foot pain.  - X-ray of left foot on 06/22/2020  resulted no acute abnormalities.  - X-ray of left ankle on 06/22/2020 resulted tiny plantar calcaneal spur, and otherwise negative.   - Continue Meloxicam as prescribed.  - Per patient request referral to Orthopedic Surgery for further evaluation and management. - meloxicam (MOBIC) 15 MG tablet; Take 1 tablet (15 mg total) by mouth daily.  Dispense: 30 tablet; Refill: 0 - Ambulatory referral to Orthopedic Surgery  4. Vaginal dryness - Pain during intercourse for at least 1 year. Endorses vaginal dryness. Denies abnormal vaginal bleeding. No history of hysterectomy. LMP: 1 year ago.  - Per patient request referral to Gynecology for further evaluation and management. - Ambulatory referral to Gynecology  5. Prolapsed uterus: - Reports was told in the past that she has a prolapsed uterus. Does urinate some when she coughs. Has 5 children. Requesting to see Gynecology.  - Per patient request referral to Gynecology for further evaluation and management. - Ambulatory referral to Gynecology  6. Depression, unspecified depression type: 7. GAD (generalized anxiety disorder): - Reports that she does have depression and anxiety. States that this is related to everyday life and being Muslim. States that she did  not understand the questions on the depression and anxiety screening. Declines medication therapy at this time, states she does not want to get addicted to anxiety/depression medication. Denies thoughts of self-harm, suicidal ideations, and homicidal ideations.  8. Language barrier: - [employer] participated in today's visit.  - Interpreter name: Larose Hires, ID#: [id number] - Interpreter name: West Pugh, ID#: [id number]  Durene Fruits, NP 07/14/2020, 9:16 PM Primary Care at [facility]

## 2020-07-15 ENCOUNTER — Other Ambulatory Visit: Payer: Self-pay

## 2020-07-15 ENCOUNTER — Ambulatory Visit: Payer: Self-pay | Admitting: Internal Medicine

## 2020-07-15 DIAGNOSIS — M25562 Pain in left knee: Secondary | ICD-10-CM

## 2020-07-15 DIAGNOSIS — G8929 Other chronic pain: Secondary | ICD-10-CM

## 2020-07-15 DIAGNOSIS — M79672 Pain in left foot: Secondary | ICD-10-CM

## 2020-07-15 DIAGNOSIS — M05671 Rheumatoid arthritis of right ankle and foot with involvement of other organs and systems: Secondary | ICD-10-CM | POA: Insufficient documentation

## 2020-07-15 MED ORDER — ALBUTEROL SULFATE HFA 108 (90 BASE) MCG/ACT IN AERS: 2.0000 | INHALATION_SPRAY | Freq: Four times a day (QID) | RESPIRATORY_TRACT | 0 refills | Status: DC | PRN

## 2020-07-15 MED ORDER — MELOXICAM 15 MG PO TABS: 15.0000 mg | ORAL_TABLET | Freq: Every day | ORAL | 0 refills | Status: DC

## 2020-07-15 NOTE — Progress Notes (Signed)
Internal MEDICINE  Office Visit Note  Patient Name: Angel Bennett  [id number]  [id number]  Date of Service: 07/15/2020  No chief complaint on file.   HPI  Pt is here for routine follow up.  C/0 knee pain- OA  Take ibuprofen/ aleve- need injection in past it helped Lab- all normal Mild vit D def- taking vitamin d supplements H/o hyperthyroid -   1 month med-  1 year ago Now TSH  Normal Recent covid- 2 week ago- still some cough and whezing- taking cough med- not helping  Current Medication: Outpatient Encounter Medications as of 07/15/2020  Medication Sig  . benzonatate (TESSALON) 100 MG capsule Take 1 capsule (100 mg total) by mouth 2 (two) times daily as needed for cough.  . cetirizine (ZYRTEC) 10 MG tablet Take 1 tablet (10 mg total) by mouth daily.  . meloxicam (MOBIC) 15 MG tablet Take 1 tablet (15 mg total) by mouth daily.  Marland Kitchen omeprazole (PRILOSEC) 40 MG capsule Take one po BID  . [DISCONTINUED] fluticasone (FLONASE) 50 MCG/ACT nasal spray Place 1 spray into both nostrils 2 (two) times daily.   No facility-administered encounter medications on file as of 07/15/2020.    Surgical History: Past Surgical History:  Procedure Laterality Date  . CESAREAN SECTION  1990  . CHOLECYSTECTOMY  03/13/2011   lap single site  . COLONOSCOPY  05/15/2011   Procedure: COLONOSCOPY;  Surgeon: Angel Dyke, MD;  Location: [facility];  Service: Endoscopy;  Laterality: N/A;  . ESOPHAGOGASTRODUODENOSCOPY  05/15/2011   Procedure: ESOPHAGOGASTRODUODENOSCOPY (EGD);  Surgeon: Angel Dyke, MD;  Location: [facility];  Service: Endoscopy;  Laterality: N/A;  . HEMORRHOID SURGERY  1990    Medical History: Past Medical History:  Diagnosis Date  . Abdominal pain    Diffuse mild  . C. difficile colitis [id number]   Treated w PO Metronidazole  . Chest pain 05/20/2015  . Constipation, chronic   . GERD (gastroesophageal reflux disease)   . H. pylori infection   . Hx of adenomatous polyp of  colon 10/27/2014  . Hyperthyroidism 05/20/2015  . Palpitations 05/20/2015  . Partial small bowel obstruction ([place]) 04/28/2013  . Ringing in ears    both ears    Family History: Family History  Problem Relation Age of Onset  . Diabetes Mother 40  . Hypertension Mother   . Hypertension Father     Social History   Socioeconomic History  . Marital status: Married    Spouse name: Not on file  . Number of children: Not on file  . Years of education: Not on file  . Highest education level: Not on file  Occupational History  . Not on file  Tobacco Use  . Smoking status: Never Smoker  . Smokeless tobacco: Never Used  Vaping Use  . Vaping Use: Never used  Substance and Sexual Activity  . Alcohol use: No  . Drug use: No  . Sexual activity: Yes    Birth control/protection: None  Other Topics Concern  . Not on file  Social History Narrative   She is married she has 4 sons and one daughter. 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 Updated 10/26/2014         Epworth Sleepiness Scale = 9 (as of 05/19/15)   Social Determinants of Health   Financial Resource Strain: Not on file  Food Insecurity: Not on file  Transportation Needs: Not on file  Physical Activity: Not on file  Stress:  Not on file  Social Connections: Not on file  Intimate Partner Violence: Not on file      Review of Systems  Vital Signs: LMP 02/19/2017 (Approximate)    Physical Exam     Assessment/Plan: H/o hyperthyriod- TSH normal- no med- recheck lab in 3 months OA knee- mobic rx sent- appt wit  Ortho for cortisone injection at clinic Cough- post covid- albuterol MDI sent. Prn use  General Counseling: Angel Bennett understanding of the findings of todays visit and agrees with plan of treatment. I have discussed any further diagnostic evaluation that may be needed or ordered today. We also reviewed her medications today. she has been encouraged to call the office with  any questions or concerns that should arise related to todays visit.    No orders of the defined types were placed in this encounter.   No orders of the defined types were placed in this encounter.       Wenda Low

## 2020-07-17 MED FILL — VENTOLIN HFA 90 MCG INHALER: 108 (90 BAS | 25 days supply | Qty: 18 | Fill #0

## 2020-07-21 NOTE — Progress Notes (Signed)
Cardiology Office Note   Date:  07/28/2020   ID:  Angel Bennett, DOB Aug 22, 1965, MRN [id number]  PCP: Grant Fontana MD Cardiologist: Dr. Harrell Gave  CC: Follow -Up    History of Present Illness: Angel Bennett is a 55 y.o. female who presents for ongoing assessment and management of moderate tricuspid regurgitation, history of subclinical hypothyroidism, I discussed couple  The machine that she has had it GERD, who was seen by Dr. Harrell Gave as a new patient (had not been seen by heart care since 2016) at the request of her primary care physician.  At the time of the office visit the patient had multiple symptoms to include lower extremity edema which was new over the last year, weight gain, more fatigue, but does not prevent her from doing normal activities.  She also noted that she had increased acid sensation and burning related to her food.  She sometimes felt her pulse in her ears, especially when her ears are clogged, and she also endorses occasional chest pain.  Echocardiogram and 10/2018 revealed a normal LVEF of 55 to 60%, tricuspid valve was not visualized, the tricuspid valve regurgitation was found to be moderate.  This was also personally reviewed by Dr. Harrell Gave.  Per Dr. Judeth Cornfield note, after review of echocardiogram, as this is a right sided valve surgical intervention is not required unless it is severe and symptomatic even though there was some question about how much benefit people received from isolated repair and replacement of the tricuspid valve.  This was reviewed with the patient.  Concerning her complaints of lower extremity edema, based on echo findings it is unlikely to be related to her TR.  She was recommended for compression stockings, leg elevation, and salt avoidance.  She felt her chest discomfort was atypical as it was tender to palpation and felt to be more likely musculoskeletal or costochondral.  Dr. Harrell Gave had a low suspicion for cardiac etiology of  her chest discomfort, suggesting that GERD may be influencing this symptom.  No further testing was planned at that time.  She comes today with improvement in complaints of GERD, but states that when she lays on her right side she has shortness of breath and is concerned that her tricuspid valve is getting worse.  She denies any significant shortness of breath dizziness or chest pain.  Past Medical History:  Diagnosis Date  . Abdominal pain    Diffuse mild  . C. difficile colitis [id number]   Treated w PO Metronidazole  . Chest pain 05/20/2015  . Constipation, chronic   . GERD (gastroesophageal reflux disease)   . H. pylori infection   . Hx of adenomatous polyp of colon 10/27/2014  . Hyperthyroidism 05/20/2015  . Palpitations 05/20/2015  . Partial small bowel obstruction ([place]) 04/28/2013  . Ringing in ears    both ears    Past Surgical History:  Procedure Laterality Date  . CESAREAN SECTION  1990  . CHOLECYSTECTOMY  03/13/2011   lap single site  . COLONOSCOPY  05/15/2011   Procedure: COLONOSCOPY;  Surgeon: Landry Dyke, MD;  Location: [facility];  Service: Endoscopy;  Laterality: N/A;  . ESOPHAGOGASTRODUODENOSCOPY  05/15/2011   Procedure: ESOPHAGOGASTRODUODENOSCOPY (EGD);  Surgeon: Landry Dyke, MD;  Location: [facility];  Service: Endoscopy;  Laterality: N/A;  . HEMORRHOID SURGERY  1990     Current Outpatient Medications  Medication Sig Dispense Refill  . albuterol (VENTOLIN HFA) 108 (90 Base) MCG/ACT inhaler Inhale 2 puffs into the lungs every 6 (  six) hours as needed for wheezing or shortness of breath. 8 g 0  . benzonatate (TESSALON) 100 MG capsule Take 1 capsule (100 mg total) by mouth 2 (two) times daily as needed for cough. 20 capsule 0  . cetirizine (ZYRTEC) 10 MG tablet Take 1 tablet (10 mg total) by mouth daily. 30 tablet 11  . meloxicam (MOBIC) 15 MG tablet Take 1 tablet (15 mg total) by mouth daily. 30 tablet 0  . meloxicam (MOBIC) 15 MG tablet Take 1 tablet  (15 mg total) by mouth daily. 30 tablet 0  . omeprazole (PRILOSEC) 40 MG capsule Take one po BID 60 capsule 6   No current facility-administered medications for this visit.    Allergies:   Patient has no known allergies.    Social History:  The patient  reports that she has never smoked. She has never used smokeless tobacco. She reports that she does not drink alcohol and does not use drugs.   Family History:  The patient's family history includes Diabetes (age of onset: 37) in her mother; Hypertension in her father and mother.    ROS: All other systems are reviewed and negative. Unless otherwise mentioned in H&P    PHYSICAL EXAM: VS:  BP 140/76   Pulse 81   Ht 5\' 4"  (1.626 m)   Wt 195 lb 12.8 oz (88.8 kg)   LMP 02/19/2017 (Approximate)   SpO2 98%   BMI 33.61 kg/m  , BMI Body mass index is 33.61 kg/m. GEN: Well nourished, well developed, in no acute distress HEENT: normal Neck: no JVD, carotid bruits, or masses Cardiac: RRR; 1/6 diastolic murmurs, heard best at the left sternal border, rubs, or gallops,no edema  Respiratory:  Clear to auscultation bilaterally, normal work of breathing GI: soft, nontender, nondistended, + BS MS: no deformity or atrophy Skin: warm and dry, no rash Neuro:  Strength and sensation are intact Psych: euthymic mood, full affect   EKG: EKG not completed this office visit.  Recent Labs: 06/22/2020: BUN 11; Creatinine, Ser 0.72; Hemoglobin 14.4; Platelets 266; Potassium 4.2; Sodium 140; TSH 0.496    Lipid Panel    Component Value Date/Time   CHOL 143 06/22/2020 0916   TRIG 79 06/22/2020 0916   HDL 45 06/22/2020 0916   CHOLHDL 3.2 06/22/2020 0916   LDLCALC 83 06/22/2020 0916      Wt Readings from Last 3 Encounters:  07/28/20 195 lb 12.8 oz (88.8 kg)  07/14/20 194 lb 6.4 oz (88.2 kg)  06/28/20 193 lb (87.5 kg)      Other studies Reviewed: Echocardiogram 11/22/18  1. The left ventricle has normal systolic function, with an ejection   fraction of 55-60%. The cavity size was normal. Left ventricular diastolic  parameters were normal. No evidence of left ventricular regional wall  motion abnormalities.  2. The right ventricle has normal systolic function. The cavity was  normal. There is no increase in right ventricular wall thickness.  3. The tricuspid valve is not well visualized. Tricuspid valve  regurgitation is moderate.  4. The aortic valve is tricuspid.  5. When compared to the prior study: 05/31/2015 - degree of TR has not  significantly changed. Side by side comparison of images performed.    ASSESSMENT AND PLAN:  1.  Moderate tricuspid regurg: The patient is having symptoms of shortness of breath when she lays on her right side.  I will repeat her echocardiogram for evaluation of changes in her tricuspid valve.  As stated above, medical management  is usual plan unless she becomes very symptomatic.  No changes in her medication.  2.  GERD: Some improvement with omeprazole.  I am going to check a BMET and magnesium to evaluate status on this medication.   Current medicines are reviewed at length with the patient today.  I have spent 25 min's  dedicated to the care of this patient on the date of this encounter to include pre-visit review of records, assessment, management and diagnostic testing,with shared decision making.  Labs/ tests ordered today include: Echocardiogram, BMET, magnesium Phill Myron. West Pugh, ANP, [facility]   07/28/2020 10:57 AM    [facility] [address] Office [phone] Fax [phone]  Notice: This dictation was prepared with Dragon dictation along with smaller phrase technology. Any transcriptional errors that result from this process are unintentional and may not be corrected upon review.

## 2020-07-26 ENCOUNTER — Telehealth: Payer: Self-pay | Admitting: Family Medicine

## 2020-07-26 ENCOUNTER — Ambulatory Visit (INDEPENDENT_AMBULATORY_CARE_PROVIDER_SITE_OTHER): Payer: Self-pay | Admitting: Family Medicine

## 2020-07-26 ENCOUNTER — Encounter: Payer: Self-pay | Admitting: Family Medicine

## 2020-07-26 ENCOUNTER — Other Ambulatory Visit: Payer: Self-pay

## 2020-07-26 DIAGNOSIS — M25572 Pain in left ankle and joints of left foot: Secondary | ICD-10-CM

## 2020-07-26 DIAGNOSIS — G8929 Other chronic pain: Secondary | ICD-10-CM

## 2020-07-26 DIAGNOSIS — M25562 Pain in left knee: Secondary | ICD-10-CM

## 2020-07-26 DIAGNOSIS — M25561 Pain in right knee: Secondary | ICD-10-CM

## 2020-07-26 NOTE — Progress Notes (Signed)
I saw and examined the patient with Dr. Elouise Munroe and agree with assessment and plan as outlined.    Chronic bilateral knee pain due to DJD.  Cortisone injections have helped.   Has 1+ effusions today.    Will request approval for bilateral gel injections.

## 2020-07-26 NOTE — Telephone Encounter (Signed)
Noted  

## 2020-07-26 NOTE — Telephone Encounter (Signed)
Requesting approval for bilateral gel injections for knee DJD.

## 2020-07-26 NOTE — Progress Notes (Signed)
Office Visit Note   Patient: Angel Bennett           Date of Birth: Nov 24, 1965           MRN: [id number] Visit Date: 07/26/2020 Requested by: Camillia Herter, NP [address] [place],  [place] [address] PCP: Patient, No Pcp Per  Subjective: Chief Complaint  Patient presents with  . Right Knee - Pain  . Left Knee - Pain  . Left Foot - Pain    HPI: 55yo F presenting to clinic with concerns of bilateral (L>R) knee pain, which has been chronic in nature. Patient states that she had a steroid shot in her knees almost exactly one year ago, but she was unhappy with how it only offered short term relief. She is interested in trying the gel injections, as she had a friend that mentioned them to her. She wants to know if this is a possibility that she could pursue.               ROS:   All other systems were reviewed and are negative.  Objective: Vital Signs: LMP 02/19/2017 (Approximate)   Physical Exam:  General:  Alert and oriented, in no acute distress. Pulm:  Breathing unlabored. Psy:  Normal mood, congruent affect. Skin:  No bruising, no rashes.   Normal Gait.  Bilateral knees with mild patellar crepitus, as well as tenderness to palpation over the medial joint lines. 1+ effusion bilaterally.  No varus of valgus laxity. Negative Lachman. No pain or clicking with Mcmurray.   Imaging: No results found.  Assessment & Plan: 55yo F presenting to clinic with persistent bilateral (L>R) knee OA. Patient is interested in trying viscosupplementation. Will request prior authorization, and contact patient when this is approved, at which time she will be scheduled for procedure. She has no additional questions today.      Procedures: No procedures performed        PMFS History: Patient Active Problem List   Diagnosis Date Noted  . 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 COVID-19 07/10/2020  . Chronic pain of both knees 06/22/2020  . Left  foot pain 06/22/2020  . Depression 06/22/2020  . GAD (generalized anxiety disorder) 06/22/2020  . Vitamin D deficiency 06/22/2020  . Epigastric pain 01/13/2019  . Bloating 01/13/2019  . Frequency of urination 12/29/2018  . Moderate tricuspid regurgitation 05/27/2017  . Seasonal allergies 04/11/2017  . Subclinical hyperthyroidism 07/11/2016  . Thyrotoxicosis 04/09/2016  . IUD (intrauterine device) in place 03/29/2016  . Perimenopause 03/20/2016  . Menorrhagia with regular cycle 02/22/2016  . Oligomenorrhea 02/22/2016  . Cluster headache 02/22/2016  . Palpitations 05/20/2015  . Chest pain 05/20/2015  . Hx of adenomatous polyp of colon 10/27/2014  . Chronic abdominal pain 04/28/2013  . History of Helicobacter pylori infection 04/28/2013  . Chronic cholecystitis 03/06/2011  . GERD (gastroesophageal reflux disease) 03/06/2011  . Constipation 03/06/2011   Past Medical History:  Diagnosis Date  . Abdominal pain    Diffuse mild  . C. difficile colitis [id number]   Treated w PO Metronidazole  . Chest pain 05/20/2015  . Constipation, chronic   . GERD (gastroesophageal reflux disease)   . H. pylori infection   . Hx of adenomatous polyp of colon 10/27/2014  . Hyperthyroidism 05/20/2015  . Palpitations 05/20/2015  . Partial small bowel obstruction ([place]) 04/28/2013  . Ringing in ears    both ears    Family History  Problem Relation Age of Onset  . Diabetes Mother 80  . Hypertension Mother   . Hypertension Father     Past Surgical History:  Procedure Laterality Date  . CESAREAN SECTION  1990  . CHOLECYSTECTOMY  03/13/2011   lap single site  . COLONOSCOPY  05/15/2011   Procedure: COLONOSCOPY;  Surgeon: Landry Dyke, MD;  Location: [facility];  Service: Endoscopy;  Laterality: N/A;  . ESOPHAGOGASTRODUODENOSCOPY  05/15/2011   Procedure: ESOPHAGOGASTRODUODENOSCOPY (EGD);  Surgeon: Landry Dyke, MD;  Location: [facility];  Service: Endoscopy;  Laterality: N/A;  .  [place]   Social History   Occupational History  . Not on file  Tobacco Use  . Smoking status: Never Smoker  . Smokeless tobacco: Never Used  Vaping Use  . Vaping Use: Never used  Substance and Sexual Activity  . Alcohol use: No  . Drug use: No  . Sexual activity: Yes    Birth control/protection: None

## 2020-07-27 ENCOUNTER — Ambulatory Visit: Payer: Medicaid Other | Admitting: Surgery

## 2020-07-28 ENCOUNTER — Telehealth: Payer: Self-pay

## 2020-07-28 ENCOUNTER — Other Ambulatory Visit: Payer: Self-pay

## 2020-07-28 ENCOUNTER — Ambulatory Visit (INDEPENDENT_AMBULATORY_CARE_PROVIDER_SITE_OTHER): Payer: Self-pay | Admitting: Adult Health

## 2020-07-28 ENCOUNTER — Other Ambulatory Visit: Payer: Self-pay | Admitting: Adult Health

## 2020-07-28 ENCOUNTER — Encounter: Payer: Self-pay | Admitting: Adult Health

## 2020-07-28 VITALS — BP 140/76 | HR 81 | Ht 64.0 in | Wt 195.8 lb

## 2020-07-28 DIAGNOSIS — Z79899 Other long term (current) drug therapy: Secondary | ICD-10-CM

## 2020-07-28 DIAGNOSIS — K219 Gastro-esophageal reflux disease without esophagitis: Secondary | ICD-10-CM

## 2020-07-28 DIAGNOSIS — I071 Rheumatic tricuspid insufficiency: Secondary | ICD-10-CM

## 2020-07-28 LAB — BASIC METABOLIC PANEL
BUN/Creatinine Ratio: 16 (ref 9–23)
BUN: 11 mg/dL (ref 6–24)
CO2: 26 mmol/L (ref 20–29)
Calcium: 9.4 mg/dL (ref 8.7–10.2)
Chloride: 105 mmol/L (ref 96–106)
Creatinine, Ser: 0.68 mg/dL (ref 0.57–1.00)
GFR calc Af Amer: 115 mL/min/{1.73_m2} (ref >59)
GFR calc non Af Amer: 100 mL/min/{1.73_m2} (ref >59)
Glucose: 95 mg/dL (ref 65–99)
Potassium: 4.5 mmol/L (ref 3.5–5.2)
Sodium: 141 mmol/L (ref 134–144)

## 2020-07-28 LAB — MAGNESIUM: Magnesium: 2 mg/dL (ref 1.6–2.3)

## 2020-07-28 MED ORDER — OMEPRAZOLE 40 MG PO CPDR: DELAYED_RELEASE_CAPSULE | ORAL | 6 refills | Status: DC

## 2020-07-28 MED FILL — OMEPRAZOLE DR 40 MG CAPSULE: 40 | 30 days supply | Qty: 60 | Fill #0

## 2020-07-28 NOTE — Telephone Encounter (Signed)
It looks like you have been handling this.

## 2020-07-28 NOTE — Telephone Encounter (Signed)
Patient called regarding last message she stated she wants to try to get approval for gel injection with [employer] [phone]

## 2020-07-28 NOTE — Telephone Encounter (Signed)
LVM for pt to call back to discuss. Pt has medcaid and it doesn't cover the gel injections. Want to offer the two options of paying the OOP for Trivisc @ $250 a knee or trying to get approval with [employer]

## 2020-07-28 NOTE — Patient Instructions (Signed)
Medication Instructions:  Continue current medications  *If you need a refill on your cardiac medications before your next appointment, please call your pharmacy*   Lab Work: BMP and Magnesium  If you have labs (blood work) drawn today and your tests are completely normal, you will receive your results only by: Marland Kitchen MyChart Message (if you have MyChart) OR . A paper copy in the mail If you have any lab test that is abnormal or we need to change your treatment, we will call you to review the results.   Testing/Procedures: Your physician has requested that you have an echocardiogram. Echocardiography is a painless test that uses sound waves to create images of your heart. It provides your doctor with information about the size and shape of your heart and how well your heart's chambers and valves are working. This procedure takes approximately one hour. There are no restrictions for this procedure.   Follow-Up: At [facility], you and your health needs are our priority.  As part of our continuing mission to provide you with exceptional heart care, we have created designated Provider Care Teams.  These Care Teams include your primary Cardiologist (physician) and Advanced Practice Providers (APPs -  Physician Assistants and Nurse Practitioners) who all work together to provide you with the care you need, when you need it.  We recommend signing up for the patient portal called "MyChart".  Sign up information is provided on this After Visit Summary.  MyChart is used to connect with patients for Virtual Visits (Telemedicine).  Patients are able to view lab/test results, encounter notes, upcoming appointments, etc.  Non-urgent messages can be sent to your provider as well.   To learn more about what you can do with MyChart, go to [email or web address].    Your next appointment:   6 month(s)  The format for your next appointment:   In Person  Provider:   You may see Buford Dresser, MD  or one of the following Advanced Practice Providers on your designated Care Team:    Rosaria Ferries, PA-C  Jory Sims, DNP, ANP

## 2020-07-31 NOTE — Telephone Encounter (Signed)
Called pt to discuss. She wants to come to the office to pick up paperwork to fill out. This was placed up front

## 2020-08-09 ENCOUNTER — Other Ambulatory Visit: Payer: Self-pay | Admitting: Orthopedic Surgery

## 2020-08-10 ENCOUNTER — Telehealth: Payer: Self-pay

## 2020-08-10 NOTE — Telephone Encounter (Signed)
Received [employer] application from patient for Monovisc, bilateral knee. Faxed completed [employer] application and HCP form to [phone].

## 2020-08-14 ENCOUNTER — Ambulatory Visit (HOSPITAL_COMMUNITY): Payer: Self-pay | Attending: Cardiology

## 2020-08-14 ENCOUNTER — Other Ambulatory Visit: Payer: Self-pay

## 2020-08-14 DIAGNOSIS — I071 Rheumatic tricuspid insufficiency: Secondary | ICD-10-CM | POA: Insufficient documentation

## 2020-08-14 LAB — ECHOCARDIOGRAM COMPLETE
Area-P 1/2: 2.51 cm2
S' Lateral: 2.3 cm

## 2020-08-15 ENCOUNTER — Telehealth: Payer: Self-pay

## 2020-08-15 NOTE — Telephone Encounter (Signed)
Received PRF form [employer] patient assistance Faxed completed PRF form to [phone].

## 2020-08-18 ENCOUNTER — Telehealth: Payer: Self-pay | Admitting: Family Medicine

## 2020-08-18 NOTE — Telephone Encounter (Signed)
Noted  

## 2020-08-18 NOTE — Telephone Encounter (Signed)
Pt is wondering if her [employer] was approved or not. The patient would like a phone call when you find out, CB [phone]

## 2020-08-19 ENCOUNTER — Encounter: Payer: Self-pay | Admitting: Orthopedic Surgery

## 2020-08-19 ENCOUNTER — Other Ambulatory Visit: Payer: Self-pay

## 2020-08-19 ENCOUNTER — Ambulatory Visit: Payer: Self-pay | Admitting: Orthopedic Surgery

## 2020-08-19 VITALS — BP 125/85 | HR 64 | Temp 98.4°F | Ht 64.0 in | Wt 196.6 lb

## 2020-08-19 DIAGNOSIS — M170 Bilateral primary osteoarthritis of knee: Secondary | ICD-10-CM

## 2020-08-19 NOTE — Progress Notes (Signed)
S: 31yof with bilateral knee pain, using meloxicam O: Ambulatory independently      Palpable crepitus bilaterally       +ve Medial and Lateral JLT       Milad papable effusion A: Bilateral Knee DJD P: Patient given cortisone injection bilateral knees.      Diclofanac Sod 75mg  BID

## 2020-08-21 NOTE — Telephone Encounter (Signed)
Talked with patient and advised her that she has been approved for gel injection through [employer], but I will not be able to schedule her appointment until I receive the medication from [employer].  Patient voiced that she understands.

## 2020-08-28 ENCOUNTER — Ambulatory Visit: Payer: Medicaid Other | Admitting: Family

## 2020-08-28 ENCOUNTER — Telehealth: Payer: Self-pay

## 2020-08-28 NOTE — Telephone Encounter (Signed)
Approved for Monovisc, right knee. Purchased through [employer] patient assistance  Appt. 08/31/2020 with Dr. Junius Roads

## 2020-08-29 ENCOUNTER — Ambulatory Visit: Payer: Medicaid Other

## 2020-08-31 ENCOUNTER — Ambulatory Visit (INDEPENDENT_AMBULATORY_CARE_PROVIDER_SITE_OTHER): Payer: Self-pay | Admitting: Family Medicine

## 2020-08-31 ENCOUNTER — Other Ambulatory Visit: Payer: Self-pay

## 2020-08-31 ENCOUNTER — Encounter: Payer: Self-pay | Admitting: Family Medicine

## 2020-08-31 DIAGNOSIS — M1711 Unilateral primary osteoarthritis, right knee: Secondary | ICD-10-CM

## 2020-08-31 DIAGNOSIS — M1712 Unilateral primary osteoarthritis, left knee: Secondary | ICD-10-CM

## 2020-08-31 NOTE — Progress Notes (Signed)
Office Visit Note   Patient: Angel Bennett           Date of Birth: Oct 19, 1965           MRN: [id number] Visit Date: 08/31/2020 Requested by: No referring provider defined for this encounter. PCP: Patient, No Pcp Per  Subjective: Chief Complaint  Patient presents with  . Right Knee - Pain, Follow-up    Planned Monovisc injections  . Left Knee - Pain, Follow-up    HPI: She is here for bilateral knee Monovisc injections.                ROS:   All other systems were reviewed and are negative.  Objective: Vital Signs: LMP 02/19/2017 (Approximate)   Physical Exam:  General:  Alert and oriented, in no acute distress. Pulm:  Breathing unlabored. Psy:  Normal mood, congruent affect. Skin: No erythema or warmth Knees: She has 1+ effusion in both.  Imaging: No results found.  Assessment & Plan: 1.  Bilateral knee osteoarthritis -Monovisc injections given today.  Follow-up as needed.     Procedures: Bilateral knee injections: After sterile prep with Betadine, injected 3 cc 0.25% bupivacaine and Monovisc from lateral midpatellar approach, a flash of clear yellow synovial fluid was obtained prior to each injection confirming intra-articular placement.       PMFS History: Patient Active Problem List   Diagnosis Date Noted  . 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 COVID-19 07/10/2020  . Chronic pain of both knees 06/22/2020  . Left foot pain 06/22/2020  . Depression 06/22/2020  . GAD (generalized anxiety disorder) 06/22/2020  . Vitamin D deficiency 06/22/2020  . Epigastric pain 01/13/2019  . Bloating 01/13/2019  . Frequency of urination 12/29/2018  . Moderate tricuspid regurgitation 05/27/2017  . Seasonal allergies 04/11/2017  . Subclinical hyperthyroidism 07/11/2016  . Thyrotoxicosis 04/09/2016  . IUD (intrauterine device) in place 03/29/2016  . Perimenopause 03/20/2016  . Menorrhagia with regular cycle 02/22/2016  .  Oligomenorrhea 02/22/2016  . Cluster headache 02/22/2016  . Palpitations 05/20/2015  . Chest pain 05/20/2015  . Hx of adenomatous polyp of colon 10/27/2014  . Chronic abdominal pain 04/28/2013  . History of Helicobacter pylori infection 04/28/2013  . Chronic cholecystitis 03/06/2011  . GERD (gastroesophageal reflux disease) 03/06/2011  . Constipation 03/06/2011   Past Medical History:  Diagnosis Date  . Abdominal pain    Diffuse mild  . C. difficile colitis [id number]   Treated w PO Metronidazole  . Chest pain 05/20/2015  . Constipation, chronic   . GERD (gastroesophageal reflux disease)   . H. pylori infection   . Hx of adenomatous polyp of colon 10/27/2014  . Hyperthyroidism 05/20/2015  . Palpitations 05/20/2015  . Partial small bowel obstruction ([place]) 04/28/2013  . Ringing in ears    both ears    Family History  Problem Relation Age of Onset  . Diabetes Mother 9  . Hypertension Mother   . Hypertension Father     Past Surgical History:  Procedure Laterality Date  . CESAREAN SECTION  1990  . CHOLECYSTECTOMY  03/13/2011   lap single site  . COLONOSCOPY  05/15/2011   Procedure: COLONOSCOPY;  Surgeon: Landry Dyke, MD;  Location: [facility];  Service: Endoscopy;  Laterality: N/A;  . ESOPHAGOGASTRODUODENOSCOPY  05/15/2011   Procedure: ESOPHAGOGASTRODUODENOSCOPY (EGD);  Surgeon: Landry Dyke, MD;  Location: [facility];  Service: Endoscopy;  Laterality: N/A;  . [place]  Social History   Occupational History  . Not on file  Tobacco Use  . Smoking status: Never Smoker  . Smokeless tobacco: Never Used  Vaping Use  . Vaping Use: Never used  Substance and Sexual Activity  . Alcohol use: No  . Drug use: No  . Sexual activity: Yes    Birth control/protection: None

## 2020-09-05 MED FILL — DICLOFENAC SOD EC 75 MG TAB: 75 | 30 days supply | Qty: 60 | Fill #0

## 2020-09-06 ENCOUNTER — Telehealth: Payer: Self-pay

## 2020-09-06 NOTE — Telephone Encounter (Signed)
Patient would like to know if she can be worked into the schedule for bilateral knee pain.  I did offer her his next available appointment, but she stated that she needed to be seen before then.  Cb# [phone].  Please advise.  Thank you.

## 2020-09-06 NOTE — Telephone Encounter (Signed)
I called and advised the patient. Dr. Marlou Sa has an available appointment tomorrow morning at 9:00. The patient chose to take this.

## 2020-09-06 NOTE — Telephone Encounter (Signed)
Unfortunately we're already full tomorrow and I'm off Friday.  Maybe one of the other providers/PAs can see her this week?

## 2020-09-06 NOTE — Telephone Encounter (Signed)
I called: the patient is s/p Monovisc injections on 08/31/20. She says the pain in her knees has worsened. She says she has some swelling in the knees. The patient is asking if there is any way to work her in this week. (next available appointment is next Wednesday). Please advise.

## 2020-09-07 ENCOUNTER — Encounter: Payer: Self-pay | Admitting: Orthopedic Surgery

## 2020-09-07 ENCOUNTER — Ambulatory Visit (INDEPENDENT_AMBULATORY_CARE_PROVIDER_SITE_OTHER): Payer: Medicaid Other | Admitting: Orthopedic Surgery

## 2020-09-07 ENCOUNTER — Other Ambulatory Visit: Payer: Self-pay

## 2020-09-07 DIAGNOSIS — M1712 Unilateral primary osteoarthritis, left knee: Secondary | ICD-10-CM

## 2020-09-07 DIAGNOSIS — M1711 Unilateral primary osteoarthritis, right knee: Secondary | ICD-10-CM

## 2020-09-07 NOTE — Progress Notes (Signed)
Office Visit Note   Patient: Angel Bennett           Date of Birth: July 30, 1965           MRN: [id number] Visit Date: 09/07/2020 Requested by: No referring provider defined for this encounter. PCP: Patient, No Pcp Per  Subjective: Chief Complaint  Patient presents with  . Right Knee - Follow-up, Pain  . Left Knee - Follow-up, Pain    HPI: Angel Bennett is a 55 year old patient with bilateral knee pain.  She has had cortisone injections this year as well as Monovisc injections on 08/31/2020.  Neither of these have helped her pain.  At most 10 to 15% pain relief for 1 to 2 weeks.  She reports medial greater than lateral knee pain.  No discrete history of injury.  Pain affects her activities of daily living as well as her walking endurance.  Does not necessarily wake her from sleep.  She tried over-the-counter anti-inflammatories.  Radiographs of the knee are reviewed and show pretty minimal arthritis but she does have slight spurring on that medial side without too much diminishment of the joint space.              ROS: All systems reviewed are negative as they relate to the chief complaint within the history of present illness.  Patient denies  fevers or chills.   Assessment & Plan: Visit Diagnoses:  1. Unilateral primary osteoarthritis, left knee   2. Unilateral primary osteoarthritis, right knee     Plan: Impression is bilateral knee pain likely with a component of mild arthritis.  May have meniscal pathology which could be treatable.  Based on her age I would favor MRI scanning to rule out any type of arthroscopically treatable problem in either knee before discussing knee replacement.  Plan MRI scan both knees and follow-up after that study.  She has tried rehabilitative exercises as well as activity modification without relief.  Follow-Up Instructions: Return for after MRI.   Orders:  No orders of the defined types were placed in this encounter.  No orders of the defined types were placed  in this encounter.     Procedures: No procedures performed   Clinical Data: No additional findings.  Objective: Vital Signs: LMP 02/19/2017 (Approximate)   Physical Exam:   Constitutional: Patient appears well-developed HEENT:  Head: Normocephalic Eyes:EOM are normal Neck: Normal range of motion Cardiovascular: Normal rate Pulmonary/chest: Effort normal Neurologic: Patient is alert Skin: Skin is warm Psychiatric: Patient has normal mood and affect    Ortho Exam: Ortho exam demonstrates pretty reasonable range of motion both knees with full extension to about 115 of flexion.  Collateral cruciate ligaments are stable bilaterally.  Not much effusion in either knee.  Has medial greater than lateral joint line tenderness bilaterally with intact extensor mechanism and no patellar apprehension.  Pedal pulses palpable.  Gait is normal.  Specialty Comments:  No specialty comments available.  Imaging: No results found.   PMFS History: Patient Active Problem List   Diagnosis Date Noted  . 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 COVID-19 07/10/2020  . Chronic pain of both knees 06/22/2020  . Left foot pain 06/22/2020  . Depression 06/22/2020  . GAD (generalized anxiety disorder) 06/22/2020  . Vitamin D deficiency 06/22/2020  . Epigastric pain 01/13/2019  . Bloating 01/13/2019  . Frequency of urination 12/29/2018  . Moderate tricuspid regurgitation 05/27/2017  . Seasonal allergies 04/11/2017  .  Subclinical hyperthyroidism 07/11/2016  . Thyrotoxicosis 04/09/2016  . IUD (intrauterine device) in place 03/29/2016  . Perimenopause 03/20/2016  . Menorrhagia with regular cycle 02/22/2016  . Oligomenorrhea 02/22/2016  . Cluster headache 02/22/2016  . Palpitations 05/20/2015  . Chest pain 05/20/2015  . Hx of adenomatous polyp of colon 10/27/2014  . Chronic abdominal pain 04/28/2013  . History of Helicobacter pylori infection 04/28/2013   . Chronic cholecystitis 03/06/2011  . GERD (gastroesophageal reflux disease) 03/06/2011  . Constipation 03/06/2011   Past Medical History:  Diagnosis Date  . Abdominal pain    Diffuse mild  . C. difficile colitis [id number]   Treated w PO Metronidazole  . Chest pain 05/20/2015  . Constipation, chronic   . GERD (gastroesophageal reflux disease)   . H. pylori infection   . Hx of adenomatous polyp of colon 10/27/2014  . Hyperthyroidism 05/20/2015  . Palpitations 05/20/2015  . Partial small bowel obstruction ([place]) 04/28/2013  . Ringing in ears    both ears    Family History  Problem Relation Age of Onset  . Diabetes Mother 59  . Hypertension Mother   . Hypertension Father     Past Surgical History:  Procedure Laterality Date  . CESAREAN SECTION  1990  . CHOLECYSTECTOMY  03/13/2011   lap single site  . COLONOSCOPY  05/15/2011   Procedure: COLONOSCOPY;  Surgeon: Landry Dyke, MD;  Location: [facility];  Service: Endoscopy;  Laterality: N/A;  . ESOPHAGOGASTRODUODENOSCOPY  05/15/2011   Procedure: ESOPHAGOGASTRODUODENOSCOPY (EGD);  Surgeon: Landry Dyke, MD;  Location: [facility];  Service: Endoscopy;  Laterality: N/A;  . [place]   Social History   Occupational History  . Not on file  Tobacco Use  . Smoking status: Never Smoker  . Smokeless tobacco: Never Used  Vaping Use  . Vaping Use: Never used  Substance and Sexual Activity  . Alcohol use: No  . Drug use: No  . Sexual activity: Yes    Birth control/protection: None

## 2020-09-09 ENCOUNTER — Ambulatory Visit
Admission: RE | Admit: 2020-09-09 | Discharge: 2020-09-09 | Disposition: A | Discharge status: Home / Self Care | Payer: PRIVATE HEALTH INSURANCE | Source: Ambulatory Visit | Attending: Orthopedic Surgery | Admitting: Orthopedic Surgery

## 2020-09-09 ENCOUNTER — Ambulatory Visit
Admission: RE | Admit: 2020-09-09 | Discharge: 2020-09-09 | Disposition: A | Discharge status: Home / Self Care | Payer: Medicaid Other | Source: Ambulatory Visit | Attending: Orthopedic Surgery | Admitting: Orthopedic Surgery

## 2020-09-09 ENCOUNTER — Other Ambulatory Visit: Payer: Self-pay

## 2020-09-09 DIAGNOSIS — M1712 Unilateral primary osteoarthritis, left knee: Secondary | ICD-10-CM

## 2020-09-09 DIAGNOSIS — M1711 Unilateral primary osteoarthritis, right knee: Secondary | ICD-10-CM

## 2020-09-11 ENCOUNTER — Telehealth: Payer: Self-pay

## 2020-09-11 NOTE — Telephone Encounter (Signed)
Do you want patient worked in or would you like to call patient with results?

## 2020-09-11 NOTE — Telephone Encounter (Signed)
Patient called sh completed her mri Saturday she would like to be worked into Dr.Deans schedule to go over results patient also stated [place] will be [place] call back:[phone]

## 2020-09-12 ENCOUNTER — Other Ambulatory Visit: Payer: Medicaid Other

## 2020-09-14 ENCOUNTER — Telehealth: Payer: Self-pay | Admitting: Orthopedic Surgery

## 2020-09-14 NOTE — Telephone Encounter (Signed)
Patient calling again.  Please advise

## 2020-09-14 NOTE — Telephone Encounter (Signed)
Patient called asked for a call back concerning her MRI results. Patient said she is leaving town [employer] and will be out of town for 6 months. The number to contact patient is [phone].

## 2020-09-15 NOTE — Telephone Encounter (Signed)
I called Angel Bennett and she has patellofemoral arthritis bilaterally nothing really arthroscopically treatable.  She has had some injections in the past.  Told her it would be good to try to get the leg muscles are strong as possible and delay knee replacement as long as possible

## 2020-09-15 NOTE — Telephone Encounter (Signed)
I called.

## 2020-09-20 ENCOUNTER — Encounter: Payer: Medicaid Other | Admitting: Obstetrics & Gynecology

## 2020-09-24 ENCOUNTER — Other Ambulatory Visit: Payer: Medicaid Other

## 2021-04-02 ENCOUNTER — Other Ambulatory Visit (HOSPITAL_COMMUNITY): Payer: Self-pay

## 2021-11-20 ENCOUNTER — Encounter: Payer: Self-pay | Admitting: Internal Medicine
